# Patient Record
Sex: Female | Born: 1935 | Race: White | Hispanic: No
Health system: Western US, Academic
[De-identification: ages and names within clinical notes are randomized; demographics above are authoritative.]

## PROBLEM LIST (undated history)

## (undated) DIAGNOSIS — M629 Disorder of muscle, unspecified: Secondary | ICD-10-CM

## (undated) DIAGNOSIS — M1990 Unspecified osteoarthritis, unspecified site: Secondary | ICD-10-CM

## (undated) DIAGNOSIS — E079 Disorder of thyroid, unspecified: Secondary | ICD-10-CM

## (undated) DIAGNOSIS — G479 Sleep disorder, unspecified: Secondary | ICD-10-CM

## (undated) DIAGNOSIS — M810 Age-related osteoporosis without current pathological fracture: Secondary | ICD-10-CM

## (undated) DIAGNOSIS — E785 Hyperlipidemia, unspecified: Secondary | ICD-10-CM

## (undated) DIAGNOSIS — I499 Cardiac arrhythmia, unspecified: Secondary | ICD-10-CM

## (undated) DIAGNOSIS — I10 Essential (primary) hypertension: Secondary | ICD-10-CM

## (undated) DIAGNOSIS — R42 Dizziness and giddiness: Secondary | ICD-10-CM

## (undated) DIAGNOSIS — K635 Polyp of colon: Secondary | ICD-10-CM

## (undated) DIAGNOSIS — J309 Allergic rhinitis, unspecified: Secondary | ICD-10-CM

## (undated) DIAGNOSIS — M8580 Other specified disorders of bone density and structure, unspecified site: Secondary | ICD-10-CM

## (undated) HISTORY — DX: Disorder of thyroid, unspecified: E07.9

## (undated) HISTORY — DX: Age-related osteoporosis without current pathological fracture: M81.0

## (undated) HISTORY — PX: PR UNLISTED PROCEDURE SHOULDER: 23929

## (undated) HISTORY — DX: Cardiac arrhythmia, unspecified: I49.9

## (undated) HISTORY — DX: Sleep disorder, unspecified: G47.9

## (undated) HISTORY — PX: PR UNLISTED PROCEDURE FOOT/TOES: 28899

## (undated) HISTORY — PX: PR XCAPSL CTRC RMVL INSJ IO LENS PROSTH CPLX WO ECP: 66982

## (undated) HISTORY — DX: Hyperlipidemia, unspecified: E78.5

## (undated) HISTORY — DX: Unspecified osteoarthritis, unspecified site: M19.90

## (undated) HISTORY — DX: Disorder of muscle, unspecified: M62.9

## (undated) HISTORY — DX: Essential (primary) hypertension: I10

## (undated) HISTORY — DX: Allergic rhinitis, unspecified: J30.9

## (undated) HISTORY — DX: Other specified disorders of bone density and structure, unspecified site: M85.80

## (undated) HISTORY — DX: Polyp of colon: K63.5

## (undated) HISTORY — DX: Dizziness and giddiness: R42

---
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
## 1974-07-17 HISTORY — PX: PR VAGINAL HYSTERECTOMY UTERUS 250 GM/<: 58260

## 1994-07-17 HISTORY — PX: PR RPR COMPLEX RETINA DETACH VITRECT &MEMBRANE PEEL: 67113

## 1997-12-22 ENCOUNTER — Ambulatory Visit (HOSPITAL_COMMUNITY): Admission: RE | Admit: 1997-12-22 | Discharge: 1997-12-22 | Payer: Self-pay | Admitting: Gastroenterology

## 1998-06-09 ENCOUNTER — Other Ambulatory Visit: Admission: RE | Admit: 1998-06-09 | Discharge: 1998-06-09 | Payer: Self-pay | Admitting: Obstetrics and Gynecology

## 1999-05-18 ENCOUNTER — Other Ambulatory Visit: Admission: RE | Admit: 1999-05-18 | Discharge: 1999-05-18 | Payer: Self-pay | Admitting: Obstetrics and Gynecology

## 1999-09-28 ENCOUNTER — Encounter: Admission: RE | Admit: 1999-09-28 | Discharge: 1999-09-28 | Payer: Self-pay | Admitting: Obstetrics and Gynecology

## 1999-09-28 ENCOUNTER — Encounter: Payer: Self-pay | Admitting: Obstetrics and Gynecology

## 2000-10-04 ENCOUNTER — Encounter: Payer: Self-pay | Admitting: Obstetrics and Gynecology

## 2000-10-04 ENCOUNTER — Encounter: Admission: RE | Admit: 2000-10-04 | Discharge: 2000-10-04 | Payer: Self-pay | Admitting: Obstetrics and Gynecology

## 2001-03-27 ENCOUNTER — Ambulatory Visit (HOSPITAL_COMMUNITY): Admission: RE | Admit: 2001-03-27 | Discharge: 2001-03-27 | Payer: Self-pay | Admitting: Gastroenterology

## 2001-03-27 ENCOUNTER — Encounter (INDEPENDENT_AMBULATORY_CARE_PROVIDER_SITE_OTHER): Payer: Self-pay | Admitting: Specialist

## 2001-06-05 ENCOUNTER — Other Ambulatory Visit: Admission: RE | Admit: 2001-06-05 | Discharge: 2001-06-05 | Payer: Self-pay | Admitting: Obstetrics and Gynecology

## 2001-07-17 HISTORY — PX: PR UNLISTED PROCEDURE SPINE: 22899

## 2001-10-07 ENCOUNTER — Encounter: Admission: RE | Admit: 2001-10-07 | Discharge: 2001-10-07 | Payer: Self-pay | Admitting: Family Medicine

## 2001-10-07 ENCOUNTER — Encounter: Payer: Self-pay | Admitting: Family Medicine

## 2002-06-05 ENCOUNTER — Other Ambulatory Visit: Admission: RE | Admit: 2002-06-05 | Discharge: 2002-06-05 | Payer: Self-pay | Admitting: Family Medicine

## 2002-10-13 ENCOUNTER — Encounter: Admission: RE | Admit: 2002-10-13 | Discharge: 2002-10-13 | Payer: Self-pay | Admitting: Family Medicine

## 2002-10-13 ENCOUNTER — Encounter: Payer: Self-pay | Admitting: Family Medicine

## 2003-07-18 HISTORY — PX: PR UNLISTED PROCEDURE LEG/ANKLE: 27899

## 2003-10-20 ENCOUNTER — Encounter: Admission: RE | Admit: 2003-10-20 | Discharge: 2003-10-20 | Payer: Self-pay | Admitting: Family Medicine

## 2004-09-28 ENCOUNTER — Other Ambulatory Visit: Admission: RE | Admit: 2004-09-28 | Discharge: 2004-09-28 | Payer: Self-pay | Admitting: Family Medicine

## 2004-11-01 ENCOUNTER — Encounter: Admission: RE | Admit: 2004-11-01 | Discharge: 2004-11-01 | Payer: Self-pay | Admitting: Family Medicine

## 2005-11-06 ENCOUNTER — Encounter: Admission: RE | Admit: 2005-11-06 | Discharge: 2005-11-06 | Payer: Self-pay | Admitting: Family Medicine

## 2006-04-16 DIAGNOSIS — K635 Polyp of colon: Secondary | ICD-10-CM

## 2006-04-16 HISTORY — DX: Polyp of colon: K63.5

## 2006-07-17 DIAGNOSIS — R42 Dizziness and giddiness: Secondary | ICD-10-CM

## 2006-07-17 HISTORY — DX: Dizziness and giddiness: R42

## 2006-11-12 ENCOUNTER — Encounter: Admission: RE | Admit: 2006-11-12 | Discharge: 2006-11-12 | Payer: Self-pay | Admitting: Family Medicine

## 2007-11-13 ENCOUNTER — Encounter: Admission: RE | Admit: 2007-11-13 | Discharge: 2007-11-13 | Payer: Self-pay | Admitting: Family Medicine

## 2007-11-25 ENCOUNTER — Encounter: Admission: RE | Admit: 2007-11-25 | Discharge: 2007-11-25 | Payer: Self-pay | Admitting: Family Medicine

## 2008-07-17 DIAGNOSIS — M8580 Other specified disorders of bone density and structure, unspecified site: Secondary | ICD-10-CM

## 2008-07-17 HISTORY — DX: Other specified disorders of bone density and structure, unspecified site: M85.80

## 2008-11-06 ENCOUNTER — Encounter (HOSPITAL_BASED_OUTPATIENT_CLINIC_OR_DEPARTMENT_OTHER): Payer: Self-pay | Admitting: Orthopaedic Surgery

## 2008-11-06 ENCOUNTER — Ambulatory Visit (HOSPITAL_BASED_OUTPATIENT_CLINIC_OR_DEPARTMENT_OTHER): Payer: Medicare Other | Admitting: Orthopaedic Surgery

## 2008-11-06 VITALS — BP 130/82 | Resp 12 | Ht 67.0 in | Wt 245.0 lb

## 2008-11-06 LAB — PR X-RAY SHOULDER 2+ VW

## 2008-11-06 NOTE — Progress Notes (Signed)
[facility] [place] [facility] of Orthopaedics & Sports Medicine  Shoulder And Elbow Service       [facility]; [address] [place] ; [place], [place]  [address]  [phone]; [phone]    [email or web address]    Primary Care Provider:  Concha Pyo, DO  [facility] [address]  [place], [place] [address]    Referring Provider:  Pcp Outside  Used For Non [facility] Pcp Not In Epic           Patient Care Team:  No providers found        Subjective History  We had the most wonderful pleasure of seeing [name] for a clinical visit.  She is most delightful 75 year old female with left shoulder pain since a fall in Dec 09 when she landed on her left shoulder and face.  She has had persistent and some weakness.  She has not had any further treatment for her shoulder since then but had an MRI scan that showed a RTC tear and was sent in for evaluation.       Related Information   Chief Complaint   Patient presents with   . Musculoskeletal Problem     left shoulder pain       Handed: right handed     Work Related Problem: No    Is a lawyer involved with this problem: No     History of Present Illness  1. Location - where is the problem located? Left Shoulder    2. Severity - Intensity of Pain/discomfort: (1 = No Pain, 10 = Severe Pain):  Pain Level: 5     3. Context - How did this problem begin? Refer to subjective note above. and Fall     4. Modifying Factors -  What makes symptom(s) worse?   Using affected side  Work  Exercise      What improves your symptom(s)?   Rest  Ice  Heat         Special Questionnaires   Right Left    SANE (Single Assessment Numeric Evaluation)  100% 50%      When applicable, Simple Shoulder Test and/or Simple Elbow Test data is available in the patient's medical record.        Review of Systems  Constitutional: weight gain and insomnia   Eyes:  glasses/contacts and cataracts    Ear/Nose/Throat: sinus trouble   Cardiovascular: irregular heartbeat   Respiratory: negative for shortness of breath, difficulty breathing, lung disease and persistent cough   Gastrointestinal: negative for decreased appetite, constipation, heartburn, nausea, diarrhea and hepatitis   Musculoskeletal:  arthritis and fractures   Genitourinary: negative for kidney stone, bladder/kidney infections, prostate problems and painful urinating   Skin/Integumentary:  negative for masses, blisters, non-healing wounds and dermatitis   Neurological:  negative for seizures, tingling, numbness and severe headaches   Psychiatric:  negative for anxiety, depression or other mental health issues   Endocrine:  negative for increased thirst, diabetes and thyroid disorders   Blood/Lymphatic: negative for bleeding or clotting problems, anemia and swollen or enlarged lymph nodes   Immunological: negative for HIV/AIDS, Sjgren's syndrome, scleroderma, hay fever and lupus   Cancer: negative for cancer        Other History  Please note that an extensive history was taken regarding Monique Martinez's past medical history, medication history,  past surgical history, allergies, family history and social history.  Please note that if one or more of the following history components states that there is no history for a particular component, this is a default statement created by our electronic medical record software and should instead read "non contributory for orthopedic concerns."    Social History   Occupational History   . Not on file.   Social History Main Topics   . Tobacco Use: Never   . Alcohol Use: No   . Drug Use: No   . Sexually Active: Not on file       Marital Status:    Number of adults living with Monique Martinez:         Past Medical History   Diagnosis Date   . HYPERLIPIDEMIA NEC/NOS    . SLEEP DISTURBANCE NOS            Past Surgical History   Procedure Date   . Vaginal hysterectomy 1976   . Remove tonsils and  adenoids 1949   . Repair retinal detach, cplx 1996   . Cataract surgery, complex 2007/2010   . Leg/ankle surgery proc unlisted 2005     tib/fib fx   . Spine surgery procedure unlisted 2003     left L4-L5 microdiscectomy       Review of patient's allergies indicates:  No Known Allergies     Current outpatient prescriptions   Medication Sig   . Aspirin 81 MG OR TABS daily   . Calcium-Vitamin D-Vitamin K (CALCIUM + D + K) 750-500-40 MG-UNT-MCG OR TABS daily   . Estrogens Conjugated (PREMARIN) 0.625 MG OR TABS daily   . Fenofibrate (TRICOR) 48 MG OR TABS daily   . Glucosamine-Chondroit-Vit C-Mn (GLUCOSAMINE 1500 COMPLEX) OR CAPS daily   . Ibandronate Sodium (BONIVA) 150 MG OR TABS monthly   . Metoprolol Succinate 100 MG OR TB24 daily   . Multiple Vitamins-Iron (MULTIVITAMIN/IRON) OR TABS daily   . POLYCARBOPHIL CALCIUM (FIBERCON) 625 MG OR TABS 2xdaily   . PrednisoLONE Acetate 1 % OP SUSP 4x daily       Family History   Problem Relation   . Arthritis    . Cancer    . Diabetes    . Heart disease    . Osteoporosis    . Thyroid               Physical Examination  Please note that a for the findings below:       "-" signifies a Negative finding       "+" signifies a Positive finding       a blank denotes test was not performed or documented    Constitutional:  General appearance:  Monique Martinez is a well developed, well nourished female in no apparent distress.   BP 130/82  Resp 12  Ht 5\' 7"  (1.702 m)  Wt 245 lb (111.131 kg)    Psychological:  Her judgment, insight, memory, mood and affect appear to be within normal limits    Neurological:  She is alert and oriented without any obvious gross neurological deficits    Repiratory:  She is without any obvious respiratory distress    ENT:  She is able to hear and understand verbal questions and commands    Upper Extremities:  Cardiovascular Inspection: Right Left   Edema Negative Negative             Respiratory: Right Left   Cyanosis Negative Negative  Hematologic:  Right Left   Ecchymosis Negative Negative             Skin Inspection:  Right Left   Rashes  Negative Negative   Lesions Negative Negative   Ulcers Negative Negative   Erythema Negative Negative   Signs of infection Negative Negative             Musculoskeletal Inspection-Visual:  Right Left   Obvious Musculoskeletal Deformity Negative Negative   Scapular Dyskinesis     AC Joint Asymmetry     SC Joint Asymmetry     "Popeye" Bicep deformity               Musculoskeletal Inspection-Palpation:  Right Left   Shoulder Crepitus   -   Rotator Cuff Defect Palpated  -   Pain With Palpation Of AC Joint  -   Painful Palpation Of Bicipital Groove  -   Bicep Saw Test (biceps)  -   Yergason's (biceps)     Lateral Epicondyle Pain               Range of Motion of Shoulder: Right Left   Total Active Forward Elevation 160 160   Total Active External Rotation 60 55   Internal Rotation Back T7 T11   External Rotation Abducted 80 75   Internal Rotation Abducted 90 80   Cross Body Adduction               Range of Motion of Elbow: Right Left   Elbow Extension      Elbow Flexion      Forearm Pronation      Forearm Supination                Strength of Shoulder: Right Left   Supraspinatus 5 5   External Rotation 5 4 with pain   Internal Rotation 5 5   Deltoid Abduction 5 5   Internal Rotation Liftoff     Pseudoparalysis Negative Negative             Strength of Elbow: Right Left   Elbow Flexion     Elbow Extension               Strength of Hand and Wrist: Right Left   Wrist Dorsiflexion      Wrist Plantarflexion      Grip Strength     Finger Extension     Interossei Use               Stability Tests of Shoulder: Right Left   Obvious Gross Instability  Negative Negative   Anterior Superior Escape     Apprehension Test      Jobe Relocation Test      Anterior Load and Shift Test     Posterior Load and Shift Test     Posterior Shoulder Instability Jerk Test     Kim (labral)     Sulcus Sign     O'Brian's (labral)     Crank (labral)     SLAP  (labral)     Dynamic External Rotation Shear (labral)     Speed's (biceps/labral)     Kibler Anterior Slide Test (labral)                Stability Tests of Elbow: Right Left   Valgus Stress Test       Moving Valgus Stress Test      Varus Stress Test       Posterolateral  Shift Test      Push Up Test      Table Top Test               Neurological: Right Left   Decreased Sensation Median Nerve  Negative Negative   Decreased Sensation Radial Nerve  Negative Negative   Decreased Sensation Ulnar Nerve  Negative Negative   Abnormal Bicep DTR               Other Tests: Right Left                    Diagnostic Tests and Studies   X-ray Studies:  All images were independently reviewed by our expert shoulder and elbow team.    Please note that a for the findings below:       "-" signifies a Negative finding       "+" signifies a Positive finding       a blank denotes finding is not applicable.     Left Upper Extremity:  Radiographs:     General:     Osteopenic bone Negative         Glenohumeral joint space narrowing Negative    Glenoid dysplasia  Negative    Glenoid biconcavity Negative         Humeral dysplasia Negative    Humeral post traumatic changes Negative    Humeral post surgical changes Negative    Humeral osteophyte formation Negative         Elbow joint space narrowing     Elbow osteophyte formation     Elbow rheumatoid changes          Acromioclavicular joint space narrowing               Stability:     Anterior shoulder dislocation Negative    Posterior shoulder dislocation Negative    High riding humeral head Negative    Inferior humeral head subluxation Negative         Elbow dislocation          Acromioclavicular joint separation      Sternoclavicular joint dislocation               Fractures:     Humeral fracture Negative    Glenoid fracture Negative    Elbow fracture     Clavicle fracture                     Other Imaging Studies:  An outside MRI scan demonstrates a full thickness rotator cuff lesion with  atrophy and retraction of the supraspinatus tendon.    Labratory:  None       Assessment  and Plan    In summary, Ms. Gary is a 75 year old female with left shoulder discomfort and dysfunction, which appears to be caused by rotator cuff syndrome.     Her xrays and MRI scan show that she has a rotator cuff tear that has been going on for quite some time.  This most likely is an acute on chronic rotator cuff tear.  Clinically she has weakness and pain with her infraspinatus but has great strength with minimal pain when stressing the supraspinatus.  I think she will do well with a course of non-operative treatment and we can get her into some therapy.  She has already been compensating for the degenerative supraspinatus tendon and with some gentle strengthening she should be able to regain most of her strength.  She responded well to steroid injections on the right shoulder after an injury several years ago and I think it may help to calm down the inflammation today as well.  I will see her back in 3 months for a repeat check but she should do well without surgery.    Procedure Note:  A procedural pause noted the patient's name allergies and procedure to be performed. Under sterile conditions 1 cc of Celestone 5 cc of 1% lidocaine plain and 5 cc of 0.5% bupivacaine were injected into the left shoulder subacromial space through a posterior portal without complications. The patient tolerated the injection well. The patient noted mild improvement in the shoulder pain prior to discharge from clinic.                       Fayette Pho, MD  Orthopaedics and Sports Medicine  [facility]  Shoulder and Elbow Team

## 2008-11-06 NOTE — Patient Instructions (Signed)
It was a pleasure seeing you in clinic today.  Please let us know if you have any questions for us and we look forward to seeing at your next appointment.    You can schedule an appointment to see us by calling [phone].    You may also find useful information about various orthopaedic conditions and what we do at [email or web address] .

## 2008-11-18 ENCOUNTER — Encounter: Admission: RE | Admit: 2008-11-18 | Discharge: 2008-11-18 | Payer: Self-pay | Admitting: Family Medicine

## 2008-11-18 NOTE — Progress Notes (Addendum)
Addended by: ENGLISH, JONATHAN E on: 11/18/2008      Modules accepted: Orders

## 2008-12-04 ENCOUNTER — Encounter (HOSPITAL_BASED_OUTPATIENT_CLINIC_OR_DEPARTMENT_OTHER): Payer: Self-pay | Admitting: Orthopaedic Surgery

## 2008-12-04 NOTE — Progress Notes (Signed)
[facility] Physical Therapy Assessment (5.17.10)

## 2009-01-20 ENCOUNTER — Encounter (HOSPITAL_BASED_OUTPATIENT_CLINIC_OR_DEPARTMENT_OTHER): Payer: Self-pay | Admitting: Orthopaedic Surgery

## 2009-02-05 ENCOUNTER — Encounter (HOSPITAL_BASED_OUTPATIENT_CLINIC_OR_DEPARTMENT_OTHER): Payer: Medicare Other | Admitting: Orthopaedic Surgery

## 2009-11-19 ENCOUNTER — Encounter: Admission: RE | Admit: 2009-11-19 | Discharge: 2009-11-19 | Payer: Self-pay | Admitting: Family Medicine

## 2010-03-06 ENCOUNTER — Other Ambulatory Visit: Payer: Self-pay

## 2010-03-13 ENCOUNTER — Other Ambulatory Visit: Payer: Self-pay

## 2010-07-17 HISTORY — PX: OTHER SURGICAL HISTORY: SHX169

## 2010-07-17 HISTORY — PX: COLONOSCOPY: SHX174

## 2010-08-07 ENCOUNTER — Encounter: Payer: Self-pay | Admitting: Family Medicine

## 2010-10-13 ENCOUNTER — Other Ambulatory Visit (HOSPITAL_COMMUNITY): Payer: Self-pay | Admitting: Orthopedic Surgery

## 2010-10-13 DIAGNOSIS — M7120 Synovial cyst of popliteal space [Baker], unspecified knee: Secondary | ICD-10-CM

## 2010-10-18 ENCOUNTER — Other Ambulatory Visit (HOSPITAL_COMMUNITY): Payer: Self-pay | Admitting: Orthopedic Surgery

## 2010-10-18 ENCOUNTER — Ambulatory Visit
Admission: RE | Admit: 2010-10-18 | Discharge: 2010-10-18 | Disposition: A | Discharge status: Home / Self Care | Payer: Medicare Other | Source: Ambulatory Visit | Attending: Orthopedic Surgery | Admitting: Orthopedic Surgery

## 2010-10-18 DIAGNOSIS — M7120 Synovial cyst of popliteal space [Baker], unspecified knee: Secondary | ICD-10-CM

## 2010-10-31 ENCOUNTER — Other Ambulatory Visit: Payer: Self-pay | Admitting: Family Medicine

## 2010-10-31 DIAGNOSIS — Z1231 Encounter for screening mammogram for malignant neoplasm of breast: Secondary | ICD-10-CM

## 2010-11-22 ENCOUNTER — Ambulatory Visit
Admission: RE | Admit: 2010-11-22 | Discharge: 2010-11-22 | Disposition: A | Discharge status: Home / Self Care | Payer: Medicare Other | Source: Ambulatory Visit | Attending: Family Medicine | Admitting: Family Medicine

## 2010-11-22 DIAGNOSIS — Z1231 Encounter for screening mammogram for malignant neoplasm of breast: Secondary | ICD-10-CM

## 2010-12-02 NOTE — Procedures (Signed)
[facility]  Patient:    Tracy Bean, Tracy Bean Visit Number: [id number] MRN: [id number]          Service Type: END Location: ENDO Attending Physician:  Louie Bun Proc. Date: 03/27/01 Admit Date:  03/27/2001   CC:         Dellis Anes. Idell Pickles, M.D.   Procedure Report  PROCEDURE:  Colonoscopy with polypectomy.  INDICATION FOR PROCEDURE:  History of adenomatous colon polyp three years ago.  DESCRIPTION OF PROCEDURE:  The patient was placed in the left lateral decubitus position and placed on the pulse monitor with continuous low-flow oxygen delivered by nasal cannula.  She was sedated with 70 mg IV Demerol and 8 mg IV Versed.  Other than the 6 mm cecal polyp which was hot biopsied, there were no abnormalities seen in the cecum, ascending, and transverse colon. Within the descending and sigmoid colon there were a few scattered diverticula.  The rectum appeared normal.  The colonoscope was then withdrawn and the patient returned to the recovery room in stable condition.  She tolerated the procedure well, and there were no immediate complications.  IMPRESSION: 1. Cecal polyp. 2. Left-sided diverticulosis.  PLAN:  Await histology and will probably repeat colonoscopy in five years. Attending Physician:  Louie Bun DD:  03/27/01 TD:  03/27/01 Job: [id number]

## 2011-11-01 ENCOUNTER — Other Ambulatory Visit: Payer: Self-pay | Admitting: Family Medicine

## 2011-11-01 DIAGNOSIS — Z1231 Encounter for screening mammogram for malignant neoplasm of breast: Secondary | ICD-10-CM

## 2011-11-24 ENCOUNTER — Ambulatory Visit
Admission: RE | Admit: 2011-11-24 | Discharge: 2011-11-24 | Disposition: A | Discharge status: Home / Self Care | Payer: Medicare Other | Source: Ambulatory Visit | Attending: Family Medicine | Admitting: Family Medicine

## 2011-11-24 DIAGNOSIS — Z1231 Encounter for screening mammogram for malignant neoplasm of breast: Secondary | ICD-10-CM

## 2012-11-07 ENCOUNTER — Other Ambulatory Visit: Payer: Self-pay

## 2012-11-07 DIAGNOSIS — Z1231 Encounter for screening mammogram for malignant neoplasm of breast: Secondary | ICD-10-CM

## 2012-11-26 ENCOUNTER — Ambulatory Visit
Admission: RE | Admit: 2012-11-26 | Discharge: 2012-11-26 | Disposition: A | Discharge status: Home / Self Care | Payer: Medicare Other | Source: Ambulatory Visit

## 2012-11-26 DIAGNOSIS — Z1231 Encounter for screening mammogram for malignant neoplasm of breast: Secondary | ICD-10-CM

## 2013-10-31 ENCOUNTER — Other Ambulatory Visit: Payer: Self-pay

## 2013-10-31 DIAGNOSIS — Z1231 Encounter for screening mammogram for malignant neoplasm of breast: Secondary | ICD-10-CM

## 2013-12-02 ENCOUNTER — Ambulatory Visit
Admission: RE | Admit: 2013-12-02 | Discharge: 2013-12-02 | Disposition: A | Discharge status: Home / Self Care | Payer: Medicare Other | Source: Ambulatory Visit

## 2013-12-02 ENCOUNTER — Encounter (INDEPENDENT_AMBULATORY_CARE_PROVIDER_SITE_OTHER): Payer: Self-pay

## 2013-12-02 DIAGNOSIS — Z1231 Encounter for screening mammogram for malignant neoplasm of breast: Secondary | ICD-10-CM

## 2014-10-29 ENCOUNTER — Other Ambulatory Visit: Payer: Self-pay

## 2014-10-29 DIAGNOSIS — Z1231 Encounter for screening mammogram for malignant neoplasm of breast: Secondary | ICD-10-CM

## 2014-12-04 ENCOUNTER — Ambulatory Visit
Admission: RE | Admit: 2014-12-04 | Discharge: 2014-12-04 | Disposition: A | Discharge status: Home / Self Care | Payer: Commercial Managed Care - HMO | Source: Ambulatory Visit

## 2014-12-04 DIAGNOSIS — Z1231 Encounter for screening mammogram for malignant neoplasm of breast: Secondary | ICD-10-CM

## 2015-08-27 ENCOUNTER — Telehealth (HOSPITAL_BASED_OUTPATIENT_CLINIC_OR_DEPARTMENT_OTHER): Payer: Self-pay

## 2015-08-27 NOTE — Telephone Encounter (Signed)
Received a call from Dr. Shirley Muscat, the pt's PCP.  Pt is a previous pt of Dr. Lunette Stands.    He is calling to get advice on how to proceed with the pt's Left Humerus fx.  Images have been pushed to PACS.    Pt lives in [place], [place].    Please call Dr. Katrinka Blazing at [phone]

## 2015-08-31 ENCOUNTER — Telehealth (HOSPITAL_BASED_OUTPATIENT_CLINIC_OR_DEPARTMENT_OTHER): Payer: Self-pay | Admitting: Physician Assistant

## 2015-08-31 NOTE — Telephone Encounter (Signed)
I spoke with Dr Katrinka Blazing- he works in the ED in [place].  His mom Shakiera sustained a ground level fall- likely mechanical while moving and handling boxes at home, about 9 days ago 08/22/15.    X-rays show a fracture/large bony bankart, the humeral head looks anteroinferiorly subluxated.    She is normally is a good state of shoulder function, helps to care for her teenage grandchild, she has SVT some neuropathy- bad back issues but is ambulatory with most often no use of her canes.    I discussed the nature of injury and that she may need open reduction with internal fixation.  The non operative treatments would be a sling and allowing the shoulder to stiffen up.  There may be high indication for surgery if she does not have marked surgical risk factors.  Katherina Right,  PA-C  [place] [facility]er and Elbow Service

## 2015-08-31 NOTE — Telephone Encounter (Signed)
Called and spoke to pt, advised she has been scheduled with De Hollingshead PA-C this Thursday 09/02/15 at 1:00, pt is coming from [facility] in [place], will let us know if delayed or can't make it due to pass conditions

## 2015-08-31 NOTE — Telephone Encounter (Signed)
I called the patient and discussed surgery with her. She is going to come to see Fritzi Mandes on Thursday as early as possible (probably 1pm). Let's book a 1pm appt with Fritzi Mandes. I will meet the patient on Friday before surgery.     Barbara Cower

## 2015-08-31 NOTE — Telephone Encounter (Signed)
I called Zoie at home [phone], she is living with her ED physician son Peyton Najjar, takes care of her 80 y/o grandson.  She fell 9 days ago 08/22/15, mechanical fall sorting out boxes and has a left shoulder glenoid fracture dislocation, is comfortable in a sling, no paresthesia, notes normal hand function.  Hx of left shoulder large SST retracted and atrophied cuff tear but shoulder was working rather well, able to reach and lift overhead and do whatever she needed wit it before this fall.  That is no longer the case- do to fall.    She has balance issues from bad back neuropathy, SVT not needing cardiology and only her PCP on metoprolol and ASA , digoxin, and exogenous thyroid hormone.    Is okay to come down for evaluation at [facility] has family to stay with in [place].  We discussed non op treatment of sling use allowing shoulder to get stiff, serial and longitudinal exam vs surgery open reduction with internal fixation vs arthroplasty.    Would be very hard to impossible to come sooner then Thursday or Friday for evaluation.  We will have our staff reach out to her to coordinate appt with Dr Raynald Kemp and team.  Katherina Right,  PA-C  [place] Shoulder and Elbow Service

## 2015-09-02 ENCOUNTER — Ambulatory Visit (HOSPITAL_BASED_OUTPATIENT_CLINIC_OR_DEPARTMENT_OTHER): Payer: Medicare Other

## 2015-09-02 ENCOUNTER — Ambulatory Visit (HOSPITAL_BASED_OUTPATIENT_CLINIC_OR_DEPARTMENT_OTHER): Payer: Medicare Other | Admitting: Physician Assistant

## 2015-09-02 ENCOUNTER — Encounter (HOSPITAL_BASED_OUTPATIENT_CLINIC_OR_DEPARTMENT_OTHER): Payer: Self-pay | Admitting: Physician Assistant

## 2015-09-02 VITALS — BP 151/70 | HR 67 | Temp 98.2°F | Ht 65.75 in | Wt 225.0 lb

## 2015-09-02 DIAGNOSIS — S42142A Displaced fracture of glenoid cavity of scapula, left shoulder, initial encounter for closed fracture: Secondary | ICD-10-CM

## 2015-09-02 DIAGNOSIS — Z01818 Encounter for other preprocedural examination: Secondary | ICD-10-CM

## 2015-09-02 DIAGNOSIS — S43012A Anterior subluxation of left humerus, initial encounter: Secondary | ICD-10-CM

## 2015-09-02 DIAGNOSIS — M75122 Complete rotator cuff tear or rupture of left shoulder, not specified as traumatic: Secondary | ICD-10-CM

## 2015-09-02 DIAGNOSIS — M25512 Pain in left shoulder: Secondary | ICD-10-CM

## 2015-09-02 DIAGNOSIS — S42292A Other displaced fracture of upper end of left humerus, initial encounter for closed fracture: Secondary | ICD-10-CM

## 2015-09-02 LAB — COMPREHENSIVE METABOLIC PANEL
ALT (GPT): 12 U/L (ref 7–33)
AST (GOT): 14 U/L (ref 9–38)
Albumin: 3.9 g/dL (ref 3.5–5.2)
Alkaline Phosphatase (Total): 82 U/L (ref 49–199)
Anion Gap: 8 (ref 4–12)
Bilirubin (Total): 0.5 mg/dL (ref 0.2–1.3)
Calcium: 9.6 mg/dL (ref 8.9–10.2)
Carbon Dioxide, Total: 31 meq/L (ref 22–32)
Chloride: 100 meq/L (ref 98–108)
Creatinine: 0.73 mg/dL (ref 0.38–1.02)
GFR, Calc, African American: >60 mL/min/{1.73_m2}
GFR, Calc, European American: >60 mL/min/{1.73_m2}
Glucose: 88 mg/dL (ref 62–125)
Potassium: 4 meq/L (ref 3.6–5.2)
Protein (Total): 6.5 g/dL (ref 6.0–8.2)
Sodium: 139 meq/L (ref 135–145)
Urea Nitrogen: 15 mg/dL (ref 8–21)

## 2015-09-02 LAB — CBC, DIFF
% Basophils: 0 %
% Eosinophils: 1 %
% Immature Granulocytes: 0 %
% Lymphocytes: 34 %
% Monocytes: 9 %
% Neutrophils: 56 %
% Nucleated RBC: 0 %
Absolute Eosinophil Count: 0.05 10*3/uL (ref 0.00–0.50)
Absolute Lymphocyte Count: 3.29 10*3/uL (ref 1.00–4.80)
Basophils: 0.04 10*3/uL (ref 0.00–0.20)
Hematocrit: 46 % — ABNORMAL HIGH (ref 36–45)
Hemoglobin: 14.8 g/dL (ref 11.5–15.5)
Immature Granulocytes: 0.03 10*3/uL (ref 0.00–0.05)
MCH: 30.9 pg (ref 27.3–33.6)
MCHC: 31.9 g/dL — ABNORMAL LOW (ref 32.2–36.5)
MCV: 97 fL (ref 81–98)
Monocytes: 0.86 10*3/uL — ABNORMAL HIGH (ref 0.00–0.80)
Neutrophils: 5.34 10*3/uL (ref 1.80–7.00)
Nucleated RBC: 0 10*3/uL
Platelet Count: 260 10*3/uL (ref 150–400)
RBC: 4.79 10*6/uL (ref 3.80–5.00)
RDW-CV: 13.1 % (ref 11.6–14.4)
WBC: 9.61 10*3/uL (ref 4.3–10.0)

## 2015-09-02 LAB — TYPE AND SCREEN
ABO/Rh: A POS
Antibody Screen: NEGATIVE

## 2015-09-02 LAB — BLOOD TYPE CONFIRMATION: ABO/Rh: A POS

## 2015-09-02 NOTE — Progress Notes (Signed)
Preoperative instruction given per [facility] protocol. Literature for [employer], and post-op instructions given to pt.  Discussed DOS and sequence of care/events.  Stressed NPO status preop, shower X2 prior to surgery with antibacterial soap. Pt advised to stop/hold the following meds:  Pt did not know she would be having surgery tomorrow, has been taking aspirin 81 mg, instructed not to take until further notice, will continue digoxin and metoprolol.  Call if further questions/concerns.

## 2015-09-02 NOTE — Patient Instructions (Signed)
It was a pleasure seeing you in clinic today.  Please let us know if you have any questions for us and we look forward to seeing at your next appointment.    If you have any questions, concerns, or would like to contact Hasnain Manheim Harvey, PA-C or Dr. Hsu directly, please feel free to e-mail me directly at [email or web address].    You can schedule an appointment to see us by calling [phone].    You may also find useful information about various orthopaedic conditions and what we do at [email or web address] .

## 2015-09-02 NOTE — Progress Notes (Signed)
[facility] Department of Orthopaedics & Sports Medicine  Shoulder And Elbow Service       [facility]; [address] [place] ; [place], [place]  [address]  [phone]; [phone]) [phone]    [email or web address]    09/02/2015    Patient Name: Monique Martinez  Date of Birth: [birth year]  Medical Record #: [id number]    Primary Care Provider:  Concha Pyo, DO  Address Alert - Do Not Mail      Referring Provider:  No ref. provider found              Patient Care Team:  No providers found     Assessment  and Plan  Monique Martinez is a 82 year old female with acute left shoulder displaced scapula fracture extending across the glenoid fossa and lateral border of the scapula with anterior inferior dislocation of the humeral head, in the setting of chronic large rotator cuff tear. She had an extensive conversation with the attending surgeon Dr. Judd Gaudier over the phone prior to today's visit. I reviewed with her again relevant anatomy, pathophysiology and management of her injury. Given the displacement of the fracture and instability of the joint, in the setting of chronic rotator cuff disease, surgery is recommended to optimize function and is likely to get her the most predictable prognosis.  Surgery would consist of left reverse shoulder arthroplasty with ORIF glenoid/scapula. I discussed the risks and benefits of surgery; risks including general medical or anesthetic complication, pain, bleeding, infection, damage to neurovascular structures, need for further surgery, perioperative dislocation, scapular or acromial fractures, loosening of the components, and peri-prosthetic fractures.  I also discussed the rehabilitation after reverse shoulder arthroplasty. Typically, the shoulder is placed in a sling for 6 weeks after surgery, then light motion exercises are started. The operative hand and elbow should always be in front of the body for the first 2 months after surgery to avoid dislocation of  the prosthesis. We have had good success with the reverse arthroplasty but are unsure of the longevity of this particular prosthesis. We ask the patient not to perform repetitive activities with the operative shoulder and avoid lifting more than 10-15 pounds with that arm to avoid premature wear of the prosthesis. She understands the post-operative rehabilitation, understands the risks of the surgery, and elects to proceed with surgery. I reviewed and signed the consent with the patient in the office today, and we will proceed with collection of preoperative labs this afternoon. She does have a history of possibly afib, rate controlled with digoxin and metoprolol, in the setting of two previous episodes of SVT. Reportedly at [facility] ER she had a stable EKG, has been asymptomatic. I put in a phone call and requested a faxed over copy of that EKG to include in her medical record.       The postoperative Support Plan calls assistance from:    Telephone Information:   Home Phone [phone]   Work Phone Not on file.   Mobile Not on file.       Accompany home after surgery: Curlene Labrum   Contact Number: [phone]   Relationship: niece            Procedures scheduled/performed: Left shoulder reverse shoulder arthroplasty and ORIF glenoid/scapula with Dr. Raynald Kemp tomorrow  Pain management: no recs today; postoperative pain medication will be prescribed by our team while in house and after discharge  Therapy/motion: none today; appropriate rehab will be  prescribed postoperatively   Follow-up: I will plan to see Monique Martinez back about 2 weeks after the above mentioned procedure for postop check.  Imaging at next appointment: Per Dr. Denyse Amass instruction         Chief Complaint   Patient presents with   . Pre-Op Exam     Pre Op Reverse L shoulder arthroplasty       SUBJECTIVE HISTORY  She is a 82 year old female with left shoulder pain following a fall Sunday, August 22, 2015. Was at her daughter's home, and tripped.  Fell predominantly on the left shoulder/arm. No significant injury to other parts of the body or loss of consciousness. Was seen at [facility] ER in [place] with xrays and given a sling. No neurologic symptoms. Pain is controlled with immobilization.      Prior to this was actually seen in 2010 in our department, and was diagnosed with large chronic supraspinatus tear. Over the years she had been managing well with conservative treatment. Prior to the fall related to today's presentation had been able to elevate and use the arm without dysfunction.    Does state that she also has a right rotator cuff tear managed nonoperatively, which doesn't bother her much.    Related Information:  Handed: right handed   Work Related Problem: No  Is a lawyer involved with this problem: No    History of Present Illness   1. Location: Left Shoulder  2. Severity (1 = No Pain, 10 = Severe Pain): 2  3. Context - How did this problem begin? Fall  4. Modifying Factors:  What makes symptom(s) worse? Using affected side  Work  Exercise  Don't know  What improves your symptom(s)? Rest/immobilization    Special Questionnaires   Right Left    SANE (Single Assessment Numeric Evaluation)  100% 0%      REVIEW OF SYSTEMS   Constitutional: negative for weight gain, weight loss, fatigue, insomnia, fever and night-sweats/chills   Eyes: glasses/contacts    Ear/Nose/Throat: negative for sinus trouble, hearing loss and ringing in ears   Cardiovascular: irregular heartbeat and history of SVT- controlled with medication   Respiratory: negative for shortness of breath, difficulty breathing, lung disease and persistent cough   Gastrointestinal: negative for decreased appetite, constipation, heartburn, nausea, diarrhea and hepatitis   Musculoskeletal:  fractures   Genitourinary: negative for kidney stone, bladder/kidney infections, prostate problems and painful urinating   Skin/Integument:  negative for masses, blisters, non-healing wounds and dermatitis    Neurological:  neuropathy bilateral lower extremities, knees down   Psychiatric:  negative for anxiety, depression or other mental health issues   Endocrine:  thyroid disorder- hypothyroidism controlled on medication   Blood/Lymphatic: negative for bleeding or clotting problems, anemia and swollen or enlarged lymph nodes   Immunological: negative for HIV/AIDS, Sjgren's syndrome, scleroderma, hay fever and lupus   Cancer: negative for cancer     Other History    Social History     Occupational History   . Not on file.     Social History Main Topics   . Smoking status: Never Smoker    . Smokeless tobacco: Not on file   . Alcohol Use: No   . Drug Use: No   . Sexual Activity: Not on file     Past Medical History   Diagnosis Date   . Other and unspecified hyperlipidemia    . Sleep disturbance, unspecified    . Arthritis    . Disorder  of muscle, ligament, and fascia    . Osteoporosis    . Thyroid disease    . Irregular heart rhythm      Past Surgical History   Procedure Laterality Date   . Vaginal hysterectomy uterus 250 gm/<  1976   . Tonsillectomy & adenoidectomy <age 49  1949   . Rpr complex retina detach vitrect &membrane peel  1996   . Xcapsular cataract rmvl insj lens prosth 1 stg  2007/2010   . Unlisted procedure leg/ankle  2005     tib/fib fx   . Unlisted procedure spine  2003     left L4-L5 microdiscectomy   . Unlisted procedure foot/toes       Medication: Takes levothyroxine, asa 81, digoxin, metoprolol, vit d3    Review of patient's allergies indicates:  No Known Allergies     Family History   Problem Relation Age of Onset   . Arthritis     . Cancer     . Diabetes     . Heart Disease     . Osteoporosis     . Thyroid Disease     . Cancer Mother    . Cancer Father    . Heart Disease Father    . Stroke Sister    . Cancer Brother    . No Family Hx Maternal Grandmother    . No Family Hx Maternal Grandfather    . No Family Hx Paternal Grandmother    . No Family Hx Paternal Grandfather        PHYSICAL  EXAMINATION    BP 151/70 mmHg  Pulse 67  Temp(Src) 98.2 F (36.8 C)  Ht 5' 5.75" (1.67 m)  Wt 225 lb (102.059 kg)  BMI 36.59 kg/m2  SpO2 94%    General appearance:  Ms. Sarracino is a well developed, well nourished female in no apparent distress. Present with her niece. Ambulates with cane in right hand.    Psychological:  Her judgment, insight, memory, mood and affect appear to be within normal limits    Neurological:  She is alert and oriented x 3     Respiratory:  She is without any obvious respiratory distress    Cardiac:  Rate controlled, stable; no murmurs    Abdomen:   Soft, nontender    ENT:  She is able to hear and understand verbal questions and commands    Spine:  Cervical spine stable  Thoracic and lumbar spine stable    LEFT SHOULDER SPECIFIC EXAM  No open lesion/wound on the left shoulder/upper extremity  Shoulder motion not assessed secondary to injury state    Neurovascular exam:  Motor neural correlate Strength Testing: Left   Deltoid; axillary nerve; C5/C6 Arm abduction at shoulder  5   Biceps, brachialis; musculocutaneous nerve; C5/C6 Elbow flexion with forearm supinated 5   Triceps; radial nerve; C6/C7/C8 Elbow extension 5   Flexor carpi radialis; median nerve; C6/C7 Wrist flexion and hand abduction 5   Flexor carpi ulnaris; ulnar nerve; C7/C8/T1 Wrist flexion and hand adduction 5   Extensor carpi radialis; radial nerve; C5/C6 Wrist extension and hand abduction 5   Extensor digitorum, extensor indicis, extensor digiti minimi; radial nerve (PIN); C7/C8 Finger extension 5   Abductor pollicis longus; radial nerve (PIN); C7/C8 Thumb abduction in plane of palm 5   Dorsal interossei, abductor digiti minimi; ulnar nerve; C8/T1 Finger abduction 5   Adductor pollicis, palmar interossei; ulnar nerve; C8/T1 Finger and thumb adduction in plane of palm 5  Opponens pollicis; median nerve; C8/T1 Thumb opposition 5   Abductor pollicis brevis; median nerve; C8/T1 Thumb abduction perpendicular to plane of palm  5   Flexor digitorum profundus to digits 2,3; median nerve; C7/C8 Flexion at DIP in digit 2 and 3 5   Flexor digitorum profundus to digits 4,5; ulnar nerve; C7/C8 Flexion at DIP in digit 4 and 5 5     Sensory: sensation intact to light tough in the lateral antebrachial cutaneous, median, ulnar, and radial distributions  Vascular: hand is warm and well-perfused with a 2+ radial pulse.     IMAGING STUDIES  X-rays: Reviewed 08/22/15 xrays of the left shoulder series, which includes ap and lateral humerus films and ap, ap in ER and ap in IR of shoulder.  By my read there is a comminuted displaced fracture that extends through the glenoid fossa to the inferior and lateral border of the scapula. Humeral head is subluxed or possibly dislocated anterior and inferior.  AP and scapular y views obtained today demonstrate unchanged position of the displaced type II scapula fracture and anterior/inferior location of the humeral head    Please note that today's visit lasted more than 60 minutes, of which more than half was spent in face-to-face counseling regarding surgical and conservative management options.      Beckey Downing, PA-C  Orthopaedics and Sports Medicine  [facility]  Shoulder and Elbow Team

## 2015-09-03 ENCOUNTER — Inpatient Hospital Stay
Admission: AD | Admit: 2015-09-03 | Discharge: 2015-09-06 | DRG: 483 | Disposition: A | Discharge status: Home / Self Care | Payer: Medicare Other | Attending: Orthopaedic Surgery | Admitting: Orthopaedic Surgery

## 2015-09-03 ENCOUNTER — Inpatient Hospital Stay (HOSPITAL_COMMUNITY): Payer: Medicare Other | Admitting: Orthopaedic Surgery

## 2015-09-03 ENCOUNTER — Other Ambulatory Visit: Payer: Self-pay | Admitting: Orthopaedic Surgery

## 2015-09-03 DIAGNOSIS — Z7901 Long term (current) use of anticoagulants: Secondary | ICD-10-CM

## 2015-09-03 DIAGNOSIS — M75122 Complete rotator cuff tear or rupture of left shoulder, not specified as traumatic: Secondary | ICD-10-CM | POA: Diagnosis present

## 2015-09-03 DIAGNOSIS — I499 Cardiac arrhythmia, unspecified: Secondary | ICD-10-CM | POA: Diagnosis present

## 2015-09-03 DIAGNOSIS — S43005A Unspecified dislocation of left shoulder joint, initial encounter: Secondary | ICD-10-CM | POA: Diagnosis present

## 2015-09-03 DIAGNOSIS — G629 Polyneuropathy, unspecified: Secondary | ICD-10-CM | POA: Diagnosis present

## 2015-09-03 DIAGNOSIS — W1830XA Fall on same level, unspecified, initial encounter: Secondary | ICD-10-CM | POA: Diagnosis present

## 2015-09-03 DIAGNOSIS — S42142A Displaced fracture of glenoid cavity of scapula, left shoulder, initial encounter for closed fracture: Secondary | ICD-10-CM

## 2015-09-03 DIAGNOSIS — E039 Hypothyroidism, unspecified: Secondary | ICD-10-CM | POA: Diagnosis present

## 2015-09-03 DIAGNOSIS — Z4731 Aftercare following explantation of shoulder joint prosthesis: Secondary | ICD-10-CM

## 2015-09-03 DIAGNOSIS — G8918 Other acute postprocedural pain: Secondary | ICD-10-CM

## 2015-09-03 DIAGNOSIS — Z96612 Presence of left artificial shoulder joint: Secondary | ICD-10-CM

## 2015-09-03 DIAGNOSIS — Z7982 Long term (current) use of aspirin: Secondary | ICD-10-CM

## 2015-09-03 DIAGNOSIS — Z6836 Body mass index (BMI) 36.0-36.9, adult: Secondary | ICD-10-CM

## 2015-09-03 DIAGNOSIS — S43002A Unspecified subluxation of left shoulder joint, initial encounter: Secondary | ICD-10-CM

## 2015-09-03 DIAGNOSIS — R7303 Prediabetes: Secondary | ICD-10-CM | POA: Diagnosis present

## 2015-09-03 DIAGNOSIS — E669 Obesity, unspecified: Secondary | ICD-10-CM | POA: Diagnosis present

## 2015-09-03 DIAGNOSIS — S42143A Displaced fracture of glenoid cavity of scapula, unspecified shoulder, initial encounter for closed fracture: Principal | ICD-10-CM | POA: Diagnosis present

## 2015-09-03 LAB — GLUCOSE POC, ~~LOC~~: Glucose (POC): 100 mg/dL (ref 62–125)

## 2015-09-04 ENCOUNTER — Encounter: Payer: Medicare Other | Admitting: Rehabilitative and Restorative Service Providers"

## 2015-09-04 LAB — BASIC METABOLIC PANEL
Anion Gap: 9 (ref 4–12)
Calcium: 8.6 mg/dL — ABNORMAL LOW (ref 8.9–10.2)
Carbon Dioxide, Total: 25 meq/L (ref 22–32)
Chloride: 104 meq/L (ref 98–108)
Creatinine: 0.66 mg/dL (ref 0.38–1.02)
GFR, Calc, African American: >60 mL/min/{1.73_m2}
GFR, Calc, European American: >60 mL/min/{1.73_m2}
Glucose: 159 mg/dL — ABNORMAL HIGH (ref 62–125)
Potassium: 4.4 meq/L (ref 3.6–5.2)
Sodium: 138 meq/L (ref 135–145)
Urea Nitrogen: 12 mg/dL (ref 8–21)

## 2015-09-04 LAB — CBC (HEMOGRAM)
Hematocrit: 39 % (ref 36–45)
Hemoglobin: 12.6 g/dL (ref 11.5–15.5)
MCH: 31.1 pg (ref 27.3–33.6)
MCHC: 32.5 g/dL (ref 32.2–36.5)
MCV: 96 fL (ref 81–98)
Platelet Count: 207 10*3/uL (ref 150–400)
RBC: 4.05 10*6/uL (ref 3.80–5.00)
RDW-CV: 13 % (ref 11.6–14.4)
WBC: 11.78 10*3/uL — ABNORMAL HIGH (ref 4.3–10.0)

## 2015-09-05 LAB — CBC (HEMOGRAM)
Hematocrit: 43 % (ref 36–45)
Hemoglobin: 13.6 g/dL (ref 11.5–15.5)
MCH: 30.8 pg (ref 27.3–33.6)
MCHC: 31.6 g/dL — ABNORMAL LOW (ref 32.2–36.5)
MCV: 98 fL (ref 81–98)
Platelet Count: 231 10*3/uL (ref 150–400)
RBC: 4.41 10*6/uL (ref 3.80–5.00)
RDW-CV: 13.2 % (ref 11.6–14.4)
WBC: 10.28 10*3/uL — ABNORMAL HIGH (ref 4.3–10.0)

## 2015-09-05 LAB — BASIC METABOLIC PANEL
Anion Gap: 5 (ref 4–12)
Calcium: 8.9 mg/dL (ref 8.9–10.2)
Carbon Dioxide, Total: 31 meq/L (ref 22–32)
Chloride: 102 meq/L (ref 98–108)
Creatinine: 0.72 mg/dL (ref 0.38–1.02)
GFR, Calc, African American: >60 mL/min/{1.73_m2}
GFR, Calc, European American: >60 mL/min/{1.73_m2}
Glucose: 110 mg/dL (ref 62–125)
Potassium: 4.2 meq/L (ref 3.6–5.2)
Sodium: 138 meq/L (ref 135–145)
Urea Nitrogen: 14 mg/dL (ref 8–21)

## 2015-09-06 DIAGNOSIS — Z79891 Long term (current) use of opiate analgesic: Secondary | ICD-10-CM

## 2015-09-06 LAB — BASIC METABOLIC PANEL
Anion Gap: 5 (ref 4–12)
Calcium: 8.3 mg/dL — ABNORMAL LOW (ref 8.9–10.2)
Carbon Dioxide, Total: 31 meq/L (ref 22–32)
Chloride: 101 meq/L (ref 98–108)
Creatinine: 0.64 mg/dL (ref 0.38–1.02)
GFR, Calc, African American: >60 mL/min/{1.73_m2}
GFR, Calc, European American: >60 mL/min/{1.73_m2}
Glucose: 121 mg/dL (ref 62–125)
Potassium: 3.9 meq/L (ref 3.6–5.2)
Sodium: 137 meq/L (ref 135–145)
Urea Nitrogen: 13 mg/dL (ref 8–21)

## 2015-09-06 LAB — CBC (HEMOGRAM)
Hematocrit: 39 % (ref 36–45)
Hemoglobin: 12.6 g/dL (ref 11.5–15.5)
MCH: 31.7 pg (ref 27.3–33.6)
MCHC: 32.7 g/dL (ref 32.2–36.5)
MCV: 97 fL (ref 81–98)
Platelet Count: 221 10*3/uL (ref 150–400)
RBC: 3.97 10*6/uL (ref 3.80–5.00)
RDW-CV: 13.2 % (ref 11.6–14.4)
WBC: 10.11 10*3/uL — ABNORMAL HIGH (ref 4.3–10.0)

## 2015-09-09 ENCOUNTER — Telehealth (HOSPITAL_BASED_OUTPATIENT_CLINIC_OR_DEPARTMENT_OTHER): Payer: Self-pay

## 2015-09-09 ENCOUNTER — Telehealth (HOSPITAL_BASED_OUTPATIENT_CLINIC_OR_DEPARTMENT_OTHER): Payer: Self-pay | Admitting: Orthopaedic Surgery

## 2015-09-09 NOTE — Telephone Encounter (Signed)
(  TEXTING IS AN OPTION FOR [facility] CLINICS ONLY)  Is this a [facility] clinic? No      RETURN CALL: General message OK      SUBJECT:  General Message     REASON FOR REQUEST: Post-op questions    MESSAGE: Patient has questions regarding dressing changes for shoulder replacement. Please call patient to discuss. Thank you!

## 2015-09-09 NOTE — Telephone Encounter (Signed)
Pt is 82 yo female s/p Left reverse total shoulder arthroplasty, Left glenoid open reduction internal fixation 09/03/15 by Dr Raynald Kemp, sched 09/15/15 for f/u with De Hollingshead PA-C  Called and spoke to pt's friend Fannie Knee, pt is staying with her for 2 weeks postop, advised OK to remove surgical dressing and shower, can clean gently around incision with mild soap and water and pat dry, can leave open to air or cover with gauze and tape prn. Fannie Knee states pt is doing very well, only taking 2 pain pills per day before she does her exercises, is eating and drinking and having BMs. Fannie Knee states understanding of info given and is satisfied with plan, encouraged to call back with any other questions/concerns.

## 2015-09-15 ENCOUNTER — Encounter (HOSPITAL_BASED_OUTPATIENT_CLINIC_OR_DEPARTMENT_OTHER): Payer: Self-pay | Admitting: Physician Assistant

## 2015-09-15 ENCOUNTER — Encounter (HOSPITAL_BASED_OUTPATIENT_CLINIC_OR_DEPARTMENT_OTHER): Payer: Medicare Other | Admitting: Physician Assistant

## 2015-09-15 ENCOUNTER — Ambulatory Visit: Payer: Medicare Other | Attending: Physician Assistant | Admitting: Physician Assistant

## 2015-09-15 VITALS — BP 150/65 | HR 66 | Temp 98.0°F | Ht 65.75 in | Wt 225.0 lb

## 2015-09-15 DIAGNOSIS — S43012D Anterior subluxation of left humerus, subsequent encounter: Secondary | ICD-10-CM | POA: Insufficient documentation

## 2015-09-15 DIAGNOSIS — S42142D Displaced fracture of glenoid cavity of scapula, left shoulder, subsequent encounter for fracture with routine healing: Secondary | ICD-10-CM | POA: Insufficient documentation

## 2015-09-15 DIAGNOSIS — M75122 Complete rotator cuff tear or rupture of left shoulder, not specified as traumatic: Secondary | ICD-10-CM | POA: Insufficient documentation

## 2015-09-15 DIAGNOSIS — Z09 Encounter for follow-up examination after completed treatment for conditions other than malignant neoplasm: Secondary | ICD-10-CM | POA: Insufficient documentation

## 2015-09-15 DIAGNOSIS — Z96612 Presence of left artificial shoulder joint: Secondary | ICD-10-CM

## 2015-09-15 NOTE — Progress Notes (Signed)
[facility] Department of Orthopaedics & Sports Medicine  Shoulder And Elbow Service       [facility]; [address] [place] ; [place], [place]  [address]  [phone]; Fax:(206) [phone]    [email or web address]    Patient Name: Monique Martinez  Date of Birth: [birth year]  Medical Record #: [id number]    Primary Care Provider:  Concha Pyo, DO  Address Alert - Do Not Mail      Referring Provider:  No ref. provider found              Patient Care Team:  No providers found     PROCEDURES PERFORMED:   1.   Left reverse total shoulder arthroplasty.    2.   Left glenoid open reduction internal fixation.      DATE OF PROCEDURE: 09/03/15    INTERIM HISTORY:  Monique Martinez returns 2 weeks after the above procedure. Pain is under control, feels very comfortable.  No neurologic symptoms. No fevers, chills, chest pain, shortness of breath, nausea, or vomiting.    EXAM:  BP 150/65 mmHg  Pulse 66  Temp(Src) 98 F (36.7 C) (Temporal)  Ht 5' 5.75" (1.67 m)  Wt 225 lb (102.059 kg)  BMI 36.59 kg/m2  SpO2 98%    General appearance:  Monique Martinez is in no apparent distress.     Shoulder exam:  Wound appears clean, dry, and intact. There is no eythema, drainage, or any signs of infection.     Shoulder ROM:  Not tested today    Neurovascular exam:  The axillary, musculocutaneous, posterior interosseous, anterior interosseous, and ulnar nerves are intact to motor function. Sensation is intact to light touch in the axillary, lateral antebrachial cutaneous, median, ulnar, and radial distributions. Hand is warm and well-perfused.     IMAGING:  Not obtained today; reviewed left shoulder xrays from postoperative stay in hospital. Stable reverse shoulder arthroplasty.     Assessment  and Plan  Monique Martinez is on expected course after surgery. Clinically doing well. I encouraged the continuation of elbow, wrist, and hand range of motion, as well as pendulums. She should remain in a sling for a total of 6  weeks from the date of surgery. Stretching/range of motion will start at 6 weeks and then we will start a light strengthening program at approximately 3 months. While in the sling it's fine for her to use her hand for small tasks, but she should not be weight bearing or moving her arm away from the body. She's quite concerned about showering independently and asked about home assistance. I provided her with a list of home health aides and estimated costs for private pay, and suggested she try calling her insurance to see if there is covered home assistance for daily activities like showering. She will let us know if she needs anything from our end with regard to documentation if she does have covered aide. Encouraged her to contact us with any questions or concerns between now and the next appointment.    Pain management: continue to taper to otc NSAIDs as tolerated  Motion/therapy: as above  Follow-up: I will plan to see Monique Martinez back in about 4 weeks      Imaging at next appointment: Left shoulder ap, grashey, axillary lateral     Beckey Downing, PA-C  Orthopaedics and Sports Medicine  [facility]  Shoulder and Elbow Team

## 2015-09-15 NOTE — Patient Instructions (Signed)
It was a pleasure seeing you in clinic today.  Please let us know if you have any questions for us and we look forward to seeing at your next appointment.    If you have any questions, concerns, or would like to contact Kiel Cockerell Harvey, PA-C or Dr. Hsu directly, please feel free to e-mail me directly at [email or web address].    You can schedule an appointment to see us by calling [phone].    You may also find useful information about various orthopaedic conditions and what we do at [email or web address] .

## 2015-09-21 ENCOUNTER — Telehealth (HOSPITAL_BASED_OUTPATIENT_CLINIC_OR_DEPARTMENT_OTHER): Payer: Self-pay | Admitting: Physician Assistant

## 2015-09-21 NOTE — Telephone Encounter (Signed)
Pt is calling in asking for a letter to be written excusing the pt from jury duty.  Pt is to report to jury duty on April 3rd.    Letter can be mailed to pt address on file.    Mardelle Mattendy questions please call the pt at [phone]

## 2015-09-21 NOTE — Telephone Encounter (Signed)
Letter written; directed to patient's mailing address

## 2015-09-30 ENCOUNTER — Telehealth (HOSPITAL_BASED_OUTPATIENT_CLINIC_OR_DEPARTMENT_OTHER): Payer: Self-pay | Admitting: Physician Assistant

## 2015-09-30 NOTE — Telephone Encounter (Signed)
Pt s/p Hsu 09/03/15 L reverse total shoulder. Fell on right side onto table due to vertigo, not operative side, but having pain on left side from elbow up to neck. Feels like it was jarred. Per [name], RN instructed pt that we would like her to see a local provider to get AP, Grashey, and scapular Y xrays, but not an axillary. Pt to call back when that is done and we will push imaging into our system. Pt thanked Charity fundraiserN for call.  _________________________________  Monique Martinez, BS, MSN, RN  [facility]  [facility]

## 2015-09-30 NOTE — Telephone Encounter (Signed)
Orders for L Grashey AP, scapular Y and AP faxed to pt PCP at [phone] w/confirmation.  _________________________________  Peyton BottomsMason P. McDaniel, BS, MSN, RN  [facility]  [place] [facility]

## 2015-09-30 NOTE — Telephone Encounter (Signed)
(  TEXTING IS AN OPTION FOR [facility] CLINICS ONLY)  Is this a [facility] clinic? No      RETURN CALL: General message OK      SUBJECT:  General Message     REASON FOR REQUEST: medical questions, request to [facility]    MESSAGE: Patient had a surgery on 02/17, left shoulder. Patient fall down and patient wants to know if she needs x-ray or not.  Please call and advise.

## 2015-10-01 NOTE — Telephone Encounter (Addendum)
**Note Monique-Identified via Obfuscation** Monique Martinez, radiologist from [facility] from [place] calling to report patient had a fall 3 days ago.    Xrays were done  - no fracture  - no loosening of hardware  - arthroplasty is looks fine    Monique Martinez can be reached on his cell at [phone] if you need to talk to him    Thanks,    Monique Martinez, PSS-2  [facility]  [facility] Rheumatology    Next appointment follow up with Monique Gaudier, MD is on 04.04.2017 at 1:10PM    ===================================================================================  Monique Martinez, Monique Martinez [id number]  Operative Report Authenticated  Service Date: Feb-17-2017  Dictated by Monique Kemp MD, Monique Martinez on [phone]    OPERATIVE REPORT:   DATE OF SERVICE:   09/03/2015.   DATE OF BIRTH:   04-24-1931.   SURGEON:   Monique Gaudier, MD  ?   FIRST SURGICAL ASSISTANT:   Monique Haver, MD   SECOND SURGICAL ASSISTANT:   Monique Hollingshead, PA-C   PREOPERATIVE DIAGNOSES:   1.   Left shoulder dislocation.    2.   Glenoid fracture.    3.   Full-thickness rotator cuff tear.    POSTOPERATIVE DIAGNOSIS:   1.   Left shoulder dislocation.    2.   Glenoid fracture.    3.   Full-thickness rotator cuff tear.    PROCEDURES PERFORMED:   1.   Left reverse total shoulder arthroplasty.    2.   Left glenoid open reduction internal fixation.    ANESTHESIA:   General.   COMPLICATIONS:   None apparent.   ESTIMATED BLOOD LOSS:   200 mL.   IMPLANTS USED:   DJO standard baseplate   32 -4 glenosphere   10 humeral stem, impaction grafted   +0 standard polyethylene   INDICATIONS FOR PROCEDURE:   This 82 year old female sustained a ground- level fall about 2 weeks ago. She sustained a shoulder dislocation and x-rays also demonstrated a substantial glenoid fracture affecting 30-40% of her glenoid. Her humeral head remained subluxed due to the glenoid fracture. She already had preexisting rotator cuff issues with pain and difficulty with range of motion prior to the fall. She was diagnosed with a full-thickness tear  before her fall. Given the combination of a preexisting rotator cuff tear, as well as glenohumeral subluxation and a glenoid fracture, we discussed reverse shoulder arthroplasty and glenoid open reduction and internal fixation of the most reliable procedure to relieve pain and improve function. After discussing all the risks and benefits of surgery, the patient elected to proceed.   DESCRIPTION OF PROCEDURE:   Prior to the patient entering the Operating Room, the patient was examined in the Preoperative Holding Area and the right shoulder was marked with a marking pen. The Anesthesia Team evaluated the patient and took the patient back to the Operating Room. In the Operating Room, a formal time-out was undertaken where the patient's name, date of birth, operative extremity, and operative procedure were verified. SCDs were applied to both lower extremities. General anesthesia was then administered and the patient was placed in the beach chair position. All bony prominences were carefully padded. IV antibiotics were indicated and given. The operative extremity was prepped and draped in a normal sterile fashion.   A standard deltopectoral interval was used. A knife was used through the skin, followed by Bovie electrocautery through the subcutaneous tissues. We found the cephalic vein and took this laterally. The subdeltoid adhesions were  released and subacromial adhesions were released. We opened the clavipectoral fascia and found the biceps tendon. This was tenotomized proximally. The subscapularis was then peeled off the lesser tuberosity and then tagged with a #2 Tevdek suture. We proceeded with an inferior capsular release and then dislocated the humerus anteriorly out of the wound. She had a preexisting superior and posterior rotator cuff tear affecting the supraspinatus and anterior infraspinatus. We protected the humeral head posteriorly and then performed a humeral head cut in 45 degrees of valgus and 20  degrees of retroversion. We then removed some osteophytes with a rongeur.   Attention was then turned to the glenoid. A Bankart retractor was placed posteriorly. We identified the fractured anterior glenoid piece. A soft tissue release was performed. A Cobb elevator was used to bring the anterior glenoid back to an anatomic position and then this was provisionally fixed with 2 K-wires. This was affecting approximately 40% of the anterior glenoid. The center point of the glenoid was then found with a 2.5 drill and then the central standard DJO tap was used. This obtained good fixation. We gently used a reamer and reamed to a flat surface, ensuring that we obtained anatomic reduction over our anterior glenoid piece. The central tack was then removed and then the standard baseplate was inserted. We drilled 4 peripheral locking screw holes and then inserted locking screws into each. After we inserted all the locking screws. We had stable fixation of our baseplate and our anterior glenoid fracture. The provisional K-wire fixation was removed and the anterior glenoid space was very solid. We then impacted a 32 -4 glenosphere into place and ensured that it was rotationally stable. We then inserted a central screw into the glenosphere into the base plate.   Attention was turned back to the proximal humerus. We reamed up to a size 10 humeral canal. A size 10 trial was inserted and then we used a proximal reamer in the proximal canal to get good proximal fixation of our trial. We trialed a +0 polyethylene and found this to provide good stability and good range of motion with no impingement. The trial was removed and then we impaction grafted some humeral head autograft into the humeral canal. We then drilled 5 holes into the lesser tuberosity and passed #2 Tevdek's for future subscapularis repair. The real size 10 humeral stem was then impacted into place and it was rotationally stable. A +0 polyethylene was impacted into  place. The joint was reduced. We again took the arm through a range of motion and found there to be good range of motion with no impingement and no instability.   All sponge, instrument and needle counts were correct. We thoroughly irrigated out the entire joint with 3 liters of saline with antibiotics. Five #2 Tevdek sutures were then passed through the subscapularis from superior to inferior and then tied down to complete the subscapularis repair. A gram of vancomycin powder was placed deep and 1 gram was placed superficial. The subcutaneous tissue was closed with 2-0 Biosyn followed by staples on the skin. A sterile dressing was placed on top. The patient's arm was placed in a sling. She was awakened from anesthesia and taken to the Post-Anesthesia care unit awake, alert, and with stable vital signs.   POSTOP REHABILITATION PROTOCOL:   We will have the patient start elbow, wrist, and hand range of motion immediately after surgery. Pendulums will start at 2 weeks. She should remain in a sling for a total of 6  weeks. More aggressive range of motion will start at 6 weeks and then we will start strengthening at approximately 3 months.   PRESENCE STATEMENT:   I was present for the entire procedure from skin incision through wound closure.   Please note the presence of Dr. Sarita Haveraniel Hackett was necessary given that there were no Residents available to assist in the procedure.   Signature Line   Electronically Reviewed/Signed On: 09/08/15 at 16:12  _______________________________________  Monique KempHsu MD, Monique GerlachJason E  Orthopaedics and Sports Medicine  [address] [place]  [place] [id number]                JEH/TUI  DD:09/04/15  TD:09/06/15      [phone]    CC Address Information  none

## 2015-10-19 ENCOUNTER — Ambulatory Visit: Payer: Medicare Other | Attending: Orthopaedic Surgery | Admitting: Orthopaedic Surgery

## 2015-10-19 ENCOUNTER — Encounter (HOSPITAL_BASED_OUTPATIENT_CLINIC_OR_DEPARTMENT_OTHER): Payer: Self-pay | Admitting: Orthopaedic Surgery

## 2015-10-19 VITALS — BP 126/62 | HR 89 | Temp 98.7°F | Ht 65.75 in | Wt 225.0 lb

## 2015-10-19 DIAGNOSIS — M19012 Primary osteoarthritis, left shoulder: Secondary | ICD-10-CM | POA: Insufficient documentation

## 2015-10-19 DIAGNOSIS — Z96612 Presence of left artificial shoulder joint: Secondary | ICD-10-CM | POA: Insufficient documentation

## 2015-10-19 NOTE — Progress Notes (Signed)
ATTENDING NOTE    I saw and evaluated the patient and agree with Kirsten Harvey's note. I personally reviewed the note and agree with the documented findings and plan of care.    Worley Radermacher E Shanae Luo, MD  Orthopaedics and Sports Medicine  [place] [facility]   Shoulder and Elbow Team

## 2015-10-19 NOTE — Progress Notes (Signed)
[place] [facility] [place] [facility] of Orthopaedics & Sports Medicine  Shoulder And Elbow Service       [facility]; [address] [place] ; [place], [place]  [id number]  [phone]; Fax:(206) [phone]    [email or web address]    Patient Name: Monique Martinez  Date of Birth: October 22, 1930  Medical Record #: [id number]    Primary Care Provider:  Concha PyoLarry D Bounds, DO  Address Alert - Do Not Mail      Referring Provider:  No ref. provider found              Patient Care Team:  No providers found     PROCEDURES PERFORMED:   1.   Left reverse total shoulder arthroplasty.    2.   Left glenoid open reduction internal fixation.      DATE OF PROCEDURE: 09/03/15    INTERIM HISTORY:  Monique Martinez returns 6 weeks after the above procedure. Comfortable. After her last visit, in early March, fell. Was light headed after standing up, was wearing her sling, but fell and was uncertain of how exactly she landed. Had some burning in the upper arm, and around the incision. Had xrays done locally which were reportedly stable; and the burning has since resolved. No present neurologic symptoms.     EXAM:  BP 126/62 mmHg  Pulse 89  Temp(Src) 98.7 F (37.1 C) (Temporal)  Ht 5' 5.75" (1.67 m)  Wt 225 lb (102.059 kg)  BMI 36.59 kg/m2  SpO2 94%    General appearance:  Monique Martinez is in no apparent distress.     Shoulder exam:  Wound appears clean, dry, and intact. There is no eythema, drainage, or any signs of infection.     Shoulder ROM:  Supine assisted elevation 80, passive external rotation 30, internal rotation gluteus    Neurovascular exam:  The axillary, musculocutaneous, posterior interosseous, anterior interosseous, and ulnar nerves are intact to motor function. Sensation is intact to light touch in the axillary, lateral antebrachial cutaneous, median, ulnar, and radial distributions. Hand is warm and well-perfused.     IMAGING:  Left shoulder ap, grashey, axillary lateral view obtained today demonstrate  satisfactory position of reverse TSA. Glenoid stable s/p ORIF    Assessment  and Plan  Monique Martinez is on expected course after left reverse shoulder arthroplasty and ORIF glenoid. Clinically doing well. Discontinue sling and resume light daily activity only. Cautioned against overuse, repetitive use, or overhead activity. Begin to progress range of motion further with stretching. Demonstrated supine self assisted forward elevation today. She will also begin to work on assisted ER and IR. Encouraged PCP follow up to address light headedness/falls.    Pain management: continue to taper to otc NSAIDs as tolerated  Motion/therapy: Referral given specifying the following protocol    PHASE II: Progressive range of motion (4 to 8 weeks)   May discontinue sling. Lifting restriction of 5 pounds should be reinforced with patient.   At 4 weeks after surgery start:    Progress supine active assist forward elevation to 150 degrees    Pulleys    Passive and active external rotation as tolerated    Isolate and strengthen scapular stabilizers.   The patient should AVOID the following motions:    Do not reach behind the back to pull up pants, perform toileting, etc. with the surgical arm until 6 weeks.    Do not put weight through the surgical arm, such as pushing up from a  chair or lifting anything over 5 pounds.   Between 8-12 weeks continue to optimize ROM, stretching in forward elevation, external rotation, cross body adduction. Begin standing assisted internal rotation. May start light deltoid strengthening/isometrics and eccentric at 8 weeks. Continue to emphasize scapular stabilizers.     PHASE III: (>12 weeks)   Lifting restriction of 5 pounds should be reinforced with patient. Okay to begin therabands   May start internal rotation movements.   Equate active and passive range of motion.   Encourage scapulohumeral mechanics during active shoulder motion.   Simulate work/recreational activities as rotator cuff strength and  endurance improve at 3 months.    Follow-up: I will plan to see Monique Martinez back in 2 months      Imaging at next appointment: Left shoulder ap, grashey, axillary lateral     Beckey Downing, PA-C  Orthopaedics and Sports Medicine  [facility]  Shoulder and Elbow Team

## 2015-10-19 NOTE — Patient Instructions (Signed)
It was a pleasure seeing you in clinic today.  Please let us know if you have any questions for us and we look forward to seeing at your next appointment.    If you have any questions, concerns, or would like to contact Dr. Amory Simonetti directly, please feel free to e-mail me directly at [email or web address].    You can schedule an appointment to see us by calling [phone].    You may also find useful information about various orthopaedic conditions and what we do at [email or web address] .

## 2015-11-05 ENCOUNTER — Other Ambulatory Visit: Payer: Self-pay

## 2015-11-05 DIAGNOSIS — Z1231 Encounter for screening mammogram for malignant neoplasm of breast: Secondary | ICD-10-CM

## 2015-12-06 ENCOUNTER — Ambulatory Visit
Admission: RE | Admit: 2015-12-06 | Discharge: 2015-12-06 | Disposition: A | Discharge status: Home / Self Care | Payer: Medicare Other | Source: Ambulatory Visit

## 2015-12-06 DIAGNOSIS — Z1231 Encounter for screening mammogram for malignant neoplasm of breast: Secondary | ICD-10-CM

## 2015-12-21 ENCOUNTER — Ambulatory Visit: Payer: Medicare Other | Attending: Orthopaedic Surgery | Admitting: Orthopaedic Surgery

## 2015-12-21 ENCOUNTER — Encounter (HOSPITAL_BASED_OUTPATIENT_CLINIC_OR_DEPARTMENT_OTHER): Payer: Self-pay | Admitting: Orthopaedic Surgery

## 2015-12-21 VITALS — BP 129/57 | HR 62 | Temp 97.5°F | Ht 65.75 in | Wt 229.2 lb

## 2015-12-21 DIAGNOSIS — Z5189 Encounter for other specified aftercare: Secondary | ICD-10-CM

## 2015-12-21 DIAGNOSIS — M19012 Primary osteoarthritis, left shoulder: Secondary | ICD-10-CM | POA: Insufficient documentation

## 2015-12-21 DIAGNOSIS — Z96612 Presence of left artificial shoulder joint: Secondary | ICD-10-CM | POA: Insufficient documentation

## 2015-12-21 NOTE — Patient Instructions (Signed)
It was a pleasure seeing you in clinic today.  Please let us know if you have any questions for us and we look forward to seeing at your next appointment.    If you have any questions, concerns, or would like to contact Dr. Kathye Cipriani directly, please feel free to e-mail me directly at [email or web address].    You can schedule an appointment to see us by calling [phone].    You may also find useful information about various orthopaedic conditions and what we do at [email or web address] .

## 2015-12-21 NOTE — Progress Notes (Signed)
[place] [facility] [place] [facility] of Orthopaedics & Sports Medicine  Shoulder And Elbow Service       [facility]; [address] [place] ; [place], [place]  [id number]  Phone:(206) [phone]; Fax:(206) [phone]    [email or web address]    Patient Name: Monique Martinez  Date of Birth: January 05, 1931  Medical Record #: [id number]    Primary Care Provider:  Concha PyoLarry D Gaffey, DO  Address Alert - Do Not Mail      Referring Provider:  No ref. provider found              Patient Care Team:  No providers found     PROCEDURES PERFORMED:   1.   Left reverse total shoulder arthroplasty.    2.   Left glenoid open reduction internal fixation.      DATE OF PROCEDURE: 09/03/15    INTERIM HISTORY:  Monique Martinez returns 3 months after the above procedure. She is doing well with minimal pain. She is able to elevate her arm above her shoulder. Happy with procedure.    EXAM:  BP 129/57   Pulse 62   Temp 97.5 F (36.4 C) (Temporal)   Ht 5' 5.75" (1.67 m)   Wt (!) 229 lb 3.2 oz (104 kg)   SpO2 94%   BMI 37.28 kg/m     General appearance:  Monique Martinez is in no apparent distress.     Shoulder exam:  Wound appears clean, dry, and intact. There is no eythema, drainage, or any signs of infection.     Shoulder ROM:  aFE 110  ER 30  IR to L5    Neurovascular exam:  The axillary, musculocutaneous, posterior interosseous, anterior interosseous, and ulnar nerves are intact to motor function. Sensation is intact to light touch in the axillary, lateral antebrachial cutaneous, median, ulnar, and radial distributions. Hand is warm and well-perfused.     IMAGING:  Left shoulder ap, grashey, axillary lateral view obtained today demonstrate satisfactory position of reverse TSA. Glenoid stable s/p ORIF    Assessment  and Plan   Monique Martinez is on expected course after left reverse shoulder arthroplasty and ORIF glenoid. Clinically doing well - continue stretching and light deltoid strengthening.     PHASE III: (>12 weeks)   Lifting restriction of  5 pounds should be reinforced with patient. Okay to begin therabands   May start internal rotation movements.   Equate active and passive range of motion.   Encourage scapulohumeral mechanics during active shoulder motion.   Simulate work/recreational activities as rotator cuff strength and endurance improve at 3 months.    Follow-up: I will plan to see Monique Martinez back at 1 year after surgery     Imaging at next appointment: Left shoulder ap, grashey, axillary lateral     Judd GaudierJason Cardale Dorer  Orthopaedics and Sports Medicine  [facility]  Shoulder and Elbow Team

## 2016-08-21 ENCOUNTER — Telehealth (HOSPITAL_BASED_OUTPATIENT_CLINIC_OR_DEPARTMENT_OTHER): Payer: Self-pay | Admitting: Orthopaedic Surgery

## 2016-08-21 NOTE — Telephone Encounter (Signed)
(  TEXTING IS AN OPTION FOR [facility] CLINICS ONLY)  Is this a [facility] clinic? No      RETURN CALL: Detailed message on voicemail only      SUBJECT:  General Message     REASON FOR REQUEST: Patient is wondering if she needs to come back during February or June.     MESSAGE: The patient is not sure if the 1 year from the time of the surgery or the time from her last appointment in June. Thank you very much.

## 2016-08-22 NOTE — Telephone Encounter (Signed)
Routing to Kirsten and Dr. Raynald KempHsu.  Please advise.  Thanks!    Sammie BenchHolly Brown, PSS/PCC Supervisor  [facility]  [facility]   Mailstop [id number]  [address] [place]   [place], [place] [id number]   2nd Floor  [email or web address]   (ph) [phone](206) [phone] (fax) [phone]

## 2016-08-22 NOTE — Telephone Encounter (Signed)
1 year after surgery which is about now. thanks

## 2016-08-23 NOTE — Telephone Encounter (Signed)
Patient is scheduled.  Thanks!    Holly Brown, PSS/PCC Supervisor  [facility] [place]  [facility] [place]  Mailstop [id number]  [address] [place] [address]  [address]nd Floor  [email or web address]  (ph) [phone] (fax) [phone]

## 2016-09-01 ENCOUNTER — Telehealth (HOSPITAL_BASED_OUTPATIENT_CLINIC_OR_DEPARTMENT_OTHER): Payer: Self-pay | Admitting: Orthopaedic Surgery

## 2016-09-01 NOTE — Telephone Encounter (Signed)
Routing to Dr. Hsu and Kirsten.  Thanks!    Holly Brown, PSS/PCC Supervisor  [facility] [place]  [facility] [place]  Mailstop [id number]  [address] [place] [address]  [address]nd Floor  [email or web address]  (ph) [phone] (fax) [phone]

## 2016-09-01 NOTE — Telephone Encounter (Signed)
(  TEXTING IS AN OPTION FOR [facility] CLINICS ONLY)  Is this a [facility] clinic? No    RETURN CALL: OK to leave detailed message with anyone that answers    SUBJECT:  General Message     REASON FOR REQUEST: Canceled appointment for 09/18/16 - Check if needed     MESSAGE: patient wanted to let Dr. Raynald KempHsu know that she does not have transportation to make it to they 1 year follow up from shoulder surgery appointment on 09/18/16. She indicated that she feels great, her shoulder is doing is fantastic, and wanted to find out if it was absolutely necessary for her to have the appointment.    [place], and traveling over the past is not easy for patient.

## 2016-09-06 NOTE — Telephone Encounter (Signed)
Phone just rings and rings, unable to leave message.  Dr. Raynald KempHsu said if she is feeling ok, it is ok that she does not make the trip in.  Thanks!    Sammie BenchHolly Brown, PSS/PCC Supervisor  [facility]  [facility]   Mailstop [id number]  [address] [place]   [place], [place] [id number]   2nd Floor  [email or web address]   (ph) [phone] (fax) [phone]

## 2016-09-18 ENCOUNTER — Encounter (HOSPITAL_BASED_OUTPATIENT_CLINIC_OR_DEPARTMENT_OTHER): Payer: Medicare Other | Admitting: Orthopaedic Surgery

## 2016-10-31 ENCOUNTER — Other Ambulatory Visit: Payer: Self-pay | Admitting: Family Medicine

## 2016-10-31 DIAGNOSIS — Z1231 Encounter for screening mammogram for malignant neoplasm of breast: Secondary | ICD-10-CM

## 2016-12-06 ENCOUNTER — Ambulatory Visit
Admission: RE | Admit: 2016-12-06 | Discharge: 2016-12-06 | Disposition: A | Discharge status: Home / Self Care | Payer: Medicare Other | Source: Ambulatory Visit | Attending: Family Medicine | Admitting: Family Medicine

## 2016-12-06 DIAGNOSIS — Z1231 Encounter for screening mammogram for malignant neoplasm of breast: Secondary | ICD-10-CM

## 2017-09-20 ENCOUNTER — Other Ambulatory Visit (HOSPITAL_BASED_OUTPATIENT_CLINIC_OR_DEPARTMENT_OTHER): Payer: Self-pay | Admitting: Family Medicine

## 2017-09-20 DIAGNOSIS — S32020A Wedge compression fracture of second lumbar vertebra, initial encounter for closed fracture: Secondary | ICD-10-CM

## 2017-09-25 ENCOUNTER — Ambulatory Visit
Admission: RE | Admit: 2017-09-25 | Discharge: 2017-09-25 | Disposition: A | Discharge status: Home / Self Care | Payer: Medicare Other | Source: Ambulatory Visit | Attending: Family Medicine | Admitting: Family Medicine

## 2017-09-25 DIAGNOSIS — S32020A Wedge compression fracture of second lumbar vertebra, initial encounter for closed fracture: Secondary | ICD-10-CM

## 2017-10-10 ENCOUNTER — Ambulatory Visit
Admission: RE | Admit: 2017-10-10 | Discharge: 2017-10-10 | Disposition: A | Discharge status: Home / Self Care | Payer: Medicare Other | Source: Ambulatory Visit | Attending: Nephrology | Admitting: Nephrology

## 2017-10-10 ENCOUNTER — Other Ambulatory Visit: Payer: Self-pay | Admitting: Nephrology

## 2017-10-10 DIAGNOSIS — E871 Hypo-osmolality and hyponatremia: Secondary | ICD-10-CM

## 2017-11-12 ENCOUNTER — Other Ambulatory Visit: Payer: Self-pay | Admitting: Family Medicine

## 2017-11-12 ENCOUNTER — Ambulatory Visit
Admission: RE | Admit: 2017-11-12 | Discharge: 2017-11-12 | Disposition: A | Discharge status: Home / Self Care | Payer: Medicare Other | Source: Ambulatory Visit | Attending: Family Medicine | Admitting: Family Medicine

## 2017-11-12 DIAGNOSIS — W19XXXA Unspecified fall, initial encounter: Secondary | ICD-10-CM

## 2017-11-12 DIAGNOSIS — M542 Cervicalgia: Secondary | ICD-10-CM

## 2018-01-22 ENCOUNTER — Other Ambulatory Visit: Payer: Self-pay | Admitting: Family Medicine

## 2018-01-22 DIAGNOSIS — Z1231 Encounter for screening mammogram for malignant neoplasm of breast: Secondary | ICD-10-CM

## 2018-02-15 ENCOUNTER — Ambulatory Visit
Admission: RE | Admit: 2018-02-15 | Discharge: 2018-02-15 | Disposition: A | Discharge status: Home / Self Care | Payer: Medicare Other | Source: Ambulatory Visit | Attending: Family Medicine | Admitting: Family Medicine

## 2018-02-15 DIAGNOSIS — Z1231 Encounter for screening mammogram for malignant neoplasm of breast: Secondary | ICD-10-CM

## 2018-03-10 ENCOUNTER — Inpatient Hospital Stay: Payer: Self-pay

## 2018-06-05 ENCOUNTER — Other Ambulatory Visit: Payer: Self-pay | Admitting: Family Medicine

## 2018-06-05 DIAGNOSIS — M810 Age-related osteoporosis without current pathological fracture: Secondary | ICD-10-CM

## 2018-06-07 ENCOUNTER — Ambulatory Visit
Admission: RE | Admit: 2018-06-07 | Discharge: 2018-06-07 | Disposition: A | Discharge status: Home / Self Care | Payer: Medicare Other | Source: Ambulatory Visit | Attending: Family Medicine | Admitting: Family Medicine

## 2018-06-07 DIAGNOSIS — M810 Age-related osteoporosis without current pathological fracture: Secondary | ICD-10-CM

## 2019-03-21 ENCOUNTER — Other Ambulatory Visit: Payer: Self-pay | Admitting: Family Medicine

## 2019-03-21 DIAGNOSIS — Z1231 Encounter for screening mammogram for malignant neoplasm of breast: Secondary | ICD-10-CM

## 2019-05-08 ENCOUNTER — Ambulatory Visit
Admission: RE | Admit: 2019-05-08 | Discharge: 2019-05-08 | Disposition: A | Discharge status: Home / Self Care | Payer: Medicare Other | Source: Ambulatory Visit | Attending: Family Medicine | Admitting: Family Medicine

## 2019-05-08 ENCOUNTER — Other Ambulatory Visit: Payer: Self-pay

## 2019-05-08 DIAGNOSIS — Z1231 Encounter for screening mammogram for malignant neoplasm of breast: Secondary | ICD-10-CM

## 2019-05-12 ENCOUNTER — Other Ambulatory Visit: Payer: Self-pay | Admitting: Family Medicine

## 2019-05-12 DIAGNOSIS — R928 Other abnormal and inconclusive findings on diagnostic imaging of breast: Secondary | ICD-10-CM

## 2019-05-20 ENCOUNTER — Other Ambulatory Visit: Payer: Self-pay

## 2019-05-20 ENCOUNTER — Ambulatory Visit
Admission: RE | Admit: 2019-05-20 | Discharge: 2019-05-20 | Disposition: A | Discharge status: Home / Self Care | Payer: Medicare Other | Source: Ambulatory Visit | Attending: Family Medicine | Admitting: Family Medicine

## 2019-05-20 DIAGNOSIS — R928 Other abnormal and inconclusive findings on diagnostic imaging of breast: Secondary | ICD-10-CM

## 2019-07-28 ENCOUNTER — Other Ambulatory Visit: Payer: Self-pay | Admitting: Family Medicine

## 2019-07-28 DIAGNOSIS — N6489 Other specified disorders of breast: Secondary | ICD-10-CM

## 2019-08-30 ENCOUNTER — Ambulatory Visit: Payer: Medicare Other | Attending: Internal Medicine

## 2019-08-30 DIAGNOSIS — Z23 Encounter for immunization: Secondary | ICD-10-CM | POA: Insufficient documentation

## 2019-08-30 NOTE — Progress Notes (Signed)
   Covid-19 Vaccination Clinic  Name:  Tracy Bean    MRN: [id number] DOB: 24-Aug-1930  08/30/2019  [name] was observed post Covid-19 immunization for 15 minutes without incidence. She was provided with Vaccine Information Sheet and instruction to access the V-Safe system.   [name] was instructed to call 911 with any severe reactions post vaccine: Marland Kitchen Difficulty breathing  . Swelling of your face and throat  . A fast heartbeat  . A bad rash all over your body  . Dizziness and weakness    Immunizations Administered    Name Date Dose VIS Date Route   Pfizer COVID-19 Vaccine 08/30/2019  2:32 PM 0.3 mL 06/27/2019 Intramuscular   Manufacturer: [place]   Lot: [id number]   [place]: [id number]

## 2019-09-22 ENCOUNTER — Ambulatory Visit: Payer: Medicare Other | Attending: Internal Medicine

## 2019-09-22 DIAGNOSIS — Z23 Encounter for immunization: Secondary | ICD-10-CM | POA: Insufficient documentation

## 2019-09-22 NOTE — Progress Notes (Signed)
   Covid-19 Vaccination Clinic  Name:  Tracy Bean    MRN: [id number] DOB: August 17, 1930  09/22/2019  [name] was observed post Covid-19 immunization for 15 minutes without incident. She was provided with Vaccine Information Sheet and instruction to access the V-Safe system.   [name] was instructed to call 911 with any severe reactions post vaccine: Marland Kitchen Difficulty breathing  . Swelling of face and throat  . A fast heartbeat  . A bad rash all over body  . Dizziness and weakness   Immunizations Administered    Name Date Dose VIS Date Route   Pfizer COVID-19 Vaccine 09/22/2019  1:25 PM 0.3 mL 06/27/2019 Intramuscular   Manufacturer: [place]   Lot: [id number]   [place]: [id number]

## 2019-11-18 ENCOUNTER — Other Ambulatory Visit: Payer: Self-pay

## 2019-11-18 ENCOUNTER — Other Ambulatory Visit: Payer: Self-pay | Admitting: Family Medicine

## 2019-11-18 ENCOUNTER — Ambulatory Visit
Admission: RE | Admit: 2019-11-18 | Discharge: 2019-11-18 | Disposition: A | Discharge status: Home / Self Care | Payer: Medicare Other | Source: Ambulatory Visit | Attending: Family Medicine | Admitting: Family Medicine

## 2019-11-18 ENCOUNTER — Ambulatory Visit: Payer: Medicare Other

## 2019-11-18 DIAGNOSIS — N6489 Other specified disorders of breast: Secondary | ICD-10-CM

## 2020-05-25 ENCOUNTER — Other Ambulatory Visit: Payer: Self-pay

## 2020-05-25 ENCOUNTER — Ambulatory Visit
Admission: RE | Admit: 2020-05-25 | Discharge: 2020-05-25 | Disposition: A | Discharge status: Home / Self Care | Payer: Medicare Other | Source: Ambulatory Visit | Attending: Family Medicine | Admitting: Family Medicine

## 2020-05-25 ENCOUNTER — Other Ambulatory Visit: Payer: Self-pay | Admitting: Family Medicine

## 2020-05-25 DIAGNOSIS — N6489 Other specified disorders of breast: Secondary | ICD-10-CM

## 2020-08-04 DIAGNOSIS — I10 Essential (primary) hypertension: Secondary | ICD-10-CM | POA: Diagnosis not present

## 2020-08-04 DIAGNOSIS — N189 Chronic kidney disease, unspecified: Secondary | ICD-10-CM | POA: Diagnosis not present

## 2020-08-04 DIAGNOSIS — E782 Mixed hyperlipidemia: Secondary | ICD-10-CM | POA: Diagnosis not present

## 2020-08-04 DIAGNOSIS — M810 Age-related osteoporosis without current pathological fracture: Secondary | ICD-10-CM | POA: Diagnosis not present

## 2020-08-16 DIAGNOSIS — H905 Unspecified sensorineural hearing loss: Secondary | ICD-10-CM | POA: Diagnosis not present

## 2020-08-19 DIAGNOSIS — Z961 Presence of intraocular lens: Secondary | ICD-10-CM | POA: Diagnosis not present

## 2020-08-19 DIAGNOSIS — H52203 Unspecified astigmatism, bilateral: Secondary | ICD-10-CM | POA: Diagnosis not present

## 2020-08-19 DIAGNOSIS — D3131 Benign neoplasm of right choroid: Secondary | ICD-10-CM | POA: Diagnosis not present

## 2020-08-19 DIAGNOSIS — H04123 Dry eye syndrome of bilateral lacrimal glands: Secondary | ICD-10-CM | POA: Diagnosis not present

## 2020-08-23 DIAGNOSIS — R1013 Epigastric pain: Secondary | ICD-10-CM | POA: Diagnosis not present

## 2020-09-17 DIAGNOSIS — M1990 Unspecified osteoarthritis, unspecified site: Secondary | ICD-10-CM | POA: Diagnosis not present

## 2020-09-17 DIAGNOSIS — E782 Mixed hyperlipidemia: Secondary | ICD-10-CM | POA: Diagnosis not present

## 2020-09-17 DIAGNOSIS — I10 Essential (primary) hypertension: Secondary | ICD-10-CM | POA: Diagnosis not present

## 2020-09-17 DIAGNOSIS — N189 Chronic kidney disease, unspecified: Secondary | ICD-10-CM | POA: Diagnosis not present

## 2020-09-17 DIAGNOSIS — M810 Age-related osteoporosis without current pathological fracture: Secondary | ICD-10-CM | POA: Diagnosis not present

## 2020-10-28 ENCOUNTER — Encounter: Payer: Self-pay | Admitting: Skilled Nursing Facility1

## 2020-10-28 ENCOUNTER — Encounter: Payer: Medicare Other | Attending: Family Medicine | Admitting: Skilled Nursing Facility1

## 2020-10-28 ENCOUNTER — Other Ambulatory Visit: Payer: Self-pay

## 2020-10-28 DIAGNOSIS — N189 Chronic kidney disease, unspecified: Secondary | ICD-10-CM | POA: Diagnosis not present

## 2020-10-28 NOTE — Progress Notes (Signed)
Medical Nutrition Therapy  Appointment Start time:  2:00  Appointment End time:  2:45  Primary concerns today: to lose weight  Referral diagnosis: weight gain, CKD Preferred learning style: auditory Learning readiness: ready   NUTRITION ASSESSMENT   Clinical Medical Hx: HTN, Hyperlidemia Medications: see list Labs: reviewed WNL Notable Signs/Symptoms: none stated  Lifestyle & Dietary Hx  Pt states she has always been in the 120's but gained weight due to covid19 and a more sedentary lifestyle.  Pt states he lives with her husband.  Pt states after realizing she gained weight she cut back on nuts, wine, and increased her activity.   Estimated daily fluid intake: 30 oz water alone Supplements: calcium Sleep: decent  Stress / self-care: pretty relaxed  Current average weekly physical activity: 3 days a week walking 30 minutes and using the stairs:   24-Hr Dietary Recall First Meal: coffee cereal + fruit or toast + 2 eggs + 2 bacon Snack:  Second Meal: salad + yogurt + crackers or half sandwich or homemade soup + crackers + cheese  Snack: nuts or candy Third Meal: salmon, kale salad, beans Snack: Beverages: coffee, water, diet soda, 4 ounces wine   Estimated Energy Needs Calories: 1300-1500   NUTRITION INTERVENTION  Nutrition education (E-1) on the following topics:  . Reduced risk of morbidity/mortality in the older population when appropriate weight   Goals: -limit to one cup of cereal using a measuring cup  -Limit after lunch snack to 1 1/4 cup of nuts and 1 piece of chocolate  -Get back into your walking habit and do a video maybe 2 days a week -Do resistance 2 days a week -You are doing great!  Handouts Provided Include   N/A  Learning Style & Readiness for Change Teaching method utilized: Visual & Auditory  Demonstrated degree of understanding via: Teach Back  Barriers to learning/adherence to lifestyle change: none identified    MONITORING &  EVALUATION Dietary intake, weekly physical activity,  Next Steps  Patient is to call if needing any further support

## 2021-02-09 DIAGNOSIS — E782 Mixed hyperlipidemia: Secondary | ICD-10-CM | POA: Diagnosis not present

## 2021-02-09 DIAGNOSIS — N189 Chronic kidney disease, unspecified: Secondary | ICD-10-CM | POA: Diagnosis not present

## 2021-02-09 DIAGNOSIS — I10 Essential (primary) hypertension: Secondary | ICD-10-CM | POA: Diagnosis not present

## 2021-02-09 DIAGNOSIS — M1990 Unspecified osteoarthritis, unspecified site: Secondary | ICD-10-CM | POA: Diagnosis not present

## 2021-02-09 DIAGNOSIS — M810 Age-related osteoporosis without current pathological fracture: Secondary | ICD-10-CM | POA: Diagnosis not present

## 2021-03-08 DIAGNOSIS — Z20822 Contact with and (suspected) exposure to covid-19: Secondary | ICD-10-CM | POA: Diagnosis not present

## 2021-03-08 DIAGNOSIS — Z03818 Encounter for observation for suspected exposure to other biological agents ruled out: Secondary | ICD-10-CM | POA: Diagnosis not present

## 2021-03-08 DIAGNOSIS — J00 Acute nasopharyngitis [common cold]: Secondary | ICD-10-CM | POA: Diagnosis not present

## 2021-03-11 DIAGNOSIS — R6883 Chills (without fever): Secondary | ICD-10-CM | POA: Diagnosis not present

## 2021-03-11 DIAGNOSIS — U071 COVID-19: Secondary | ICD-10-CM | POA: Diagnosis not present

## 2021-03-11 DIAGNOSIS — R051 Acute cough: Secondary | ICD-10-CM | POA: Diagnosis not present

## 2021-04-05 DIAGNOSIS — M1990 Unspecified osteoarthritis, unspecified site: Secondary | ICD-10-CM | POA: Diagnosis not present

## 2021-04-05 DIAGNOSIS — N189 Chronic kidney disease, unspecified: Secondary | ICD-10-CM | POA: Diagnosis not present

## 2021-04-05 DIAGNOSIS — M810 Age-related osteoporosis without current pathological fracture: Secondary | ICD-10-CM | POA: Diagnosis not present

## 2021-04-05 DIAGNOSIS — E782 Mixed hyperlipidemia: Secondary | ICD-10-CM | POA: Diagnosis not present

## 2021-04-05 DIAGNOSIS — I10 Essential (primary) hypertension: Secondary | ICD-10-CM | POA: Diagnosis not present

## 2021-04-27 ENCOUNTER — Other Ambulatory Visit: Payer: Self-pay | Admitting: Family Medicine

## 2021-04-27 DIAGNOSIS — N6489 Other specified disorders of breast: Secondary | ICD-10-CM

## 2021-06-02 ENCOUNTER — Ambulatory Visit
Admission: RE | Admit: 2021-06-02 | Discharge: 2021-06-02 | Disposition: A | Discharge status: Home / Self Care | Payer: Medicare Other | Source: Ambulatory Visit | Attending: Family Medicine | Admitting: Family Medicine

## 2021-06-02 ENCOUNTER — Ambulatory Visit: Payer: Medicare Other

## 2021-06-02 DIAGNOSIS — N6489 Other specified disorders of breast: Secondary | ICD-10-CM

## 2021-06-02 DIAGNOSIS — R922 Inconclusive mammogram: Secondary | ICD-10-CM | POA: Diagnosis not present

## 2021-06-20 DIAGNOSIS — Z0000 Encounter for general adult medical examination without abnormal findings: Secondary | ICD-10-CM | POA: Diagnosis not present

## 2021-06-20 DIAGNOSIS — L299 Pruritus, unspecified: Secondary | ICD-10-CM | POA: Diagnosis not present

## 2021-06-20 DIAGNOSIS — E782 Mixed hyperlipidemia: Secondary | ICD-10-CM | POA: Diagnosis not present

## 2021-06-20 DIAGNOSIS — M810 Age-related osteoporosis without current pathological fracture: Secondary | ICD-10-CM | POA: Diagnosis not present

## 2021-06-20 DIAGNOSIS — D72821 Monocytosis (symptomatic): Secondary | ICD-10-CM | POA: Diagnosis not present

## 2021-08-22 DIAGNOSIS — H524 Presbyopia: Secondary | ICD-10-CM | POA: Diagnosis not present

## 2021-08-22 DIAGNOSIS — H04123 Dry eye syndrome of bilateral lacrimal glands: Secondary | ICD-10-CM | POA: Diagnosis not present

## 2021-08-22 DIAGNOSIS — Z961 Presence of intraocular lens: Secondary | ICD-10-CM | POA: Diagnosis not present

## 2021-11-02 DIAGNOSIS — N2581 Secondary hyperparathyroidism of renal origin: Secondary | ICD-10-CM | POA: Diagnosis not present

## 2021-11-02 DIAGNOSIS — N182 Chronic kidney disease, stage 2 (mild): Secondary | ICD-10-CM | POA: Diagnosis not present

## 2021-11-08 DIAGNOSIS — N2581 Secondary hyperparathyroidism of renal origin: Secondary | ICD-10-CM | POA: Diagnosis not present

## 2021-11-08 DIAGNOSIS — N182 Chronic kidney disease, stage 2 (mild): Secondary | ICD-10-CM | POA: Diagnosis not present

## 2021-11-08 DIAGNOSIS — E871 Hypo-osmolality and hyponatremia: Secondary | ICD-10-CM | POA: Diagnosis not present

## 2021-11-08 DIAGNOSIS — D631 Anemia in chronic kidney disease: Secondary | ICD-10-CM | POA: Diagnosis not present

## 2021-11-08 DIAGNOSIS — I129 Hypertensive chronic kidney disease with stage 1 through stage 4 chronic kidney disease, or unspecified chronic kidney disease: Secondary | ICD-10-CM | POA: Diagnosis not present

## 2021-11-29 DIAGNOSIS — M170 Bilateral primary osteoarthritis of knee: Secondary | ICD-10-CM | POA: Diagnosis not present

## 2022-01-17 IMAGING — MG DIGITAL DIAGNOSTIC BILAT W/ TOMO W/ CAD
6 of 10 series · 6 of 30 positions shown · non-contrast
Comparison: Previous exam(s).

CLINICAL DATA: Follow-up for a probably benign asymmetry in the
right breast, initially assessed in May 2019.

EXAM:
DIGITAL DIAGNOSTIC BILATERAL MAMMOGRAM WITH TOMOSYNTHESIS AND CAD
TECHNIQUE: Bilateral digital diagnostic mammography and breast tomosynthesis
was performed. The images were evaluated with computer-aided
detection.

[L CC synth-2D]
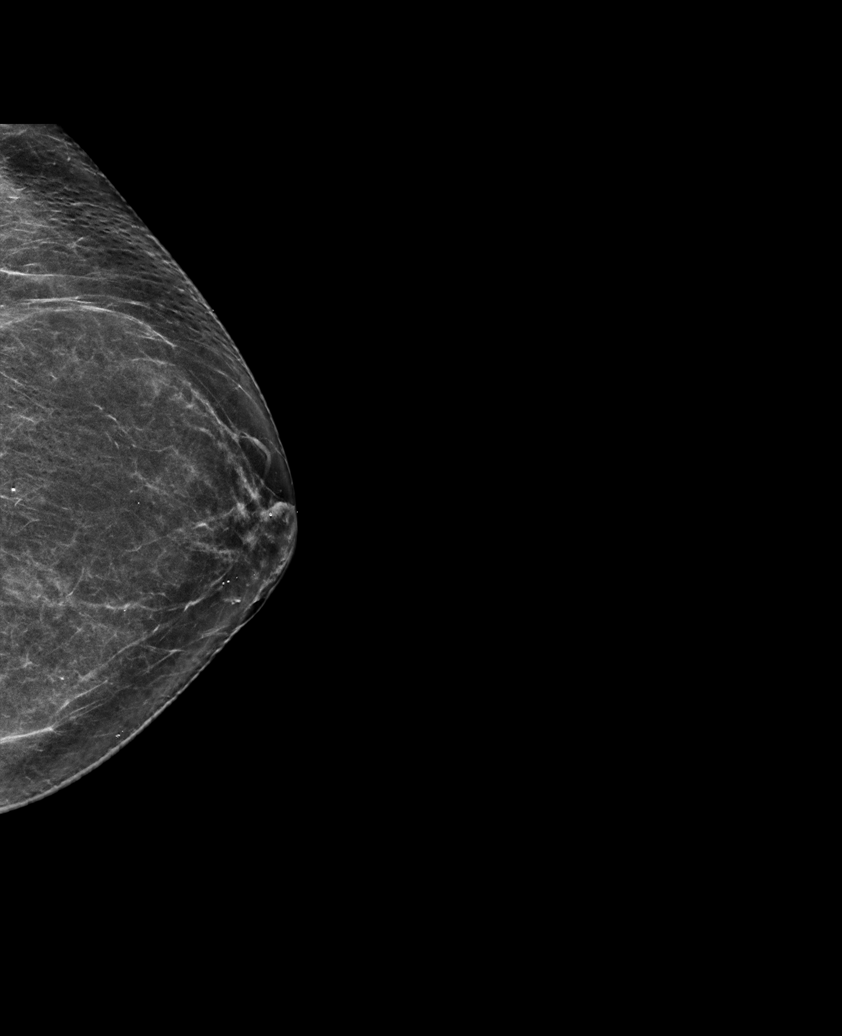

[R CC synth-2D (1 of 2)]
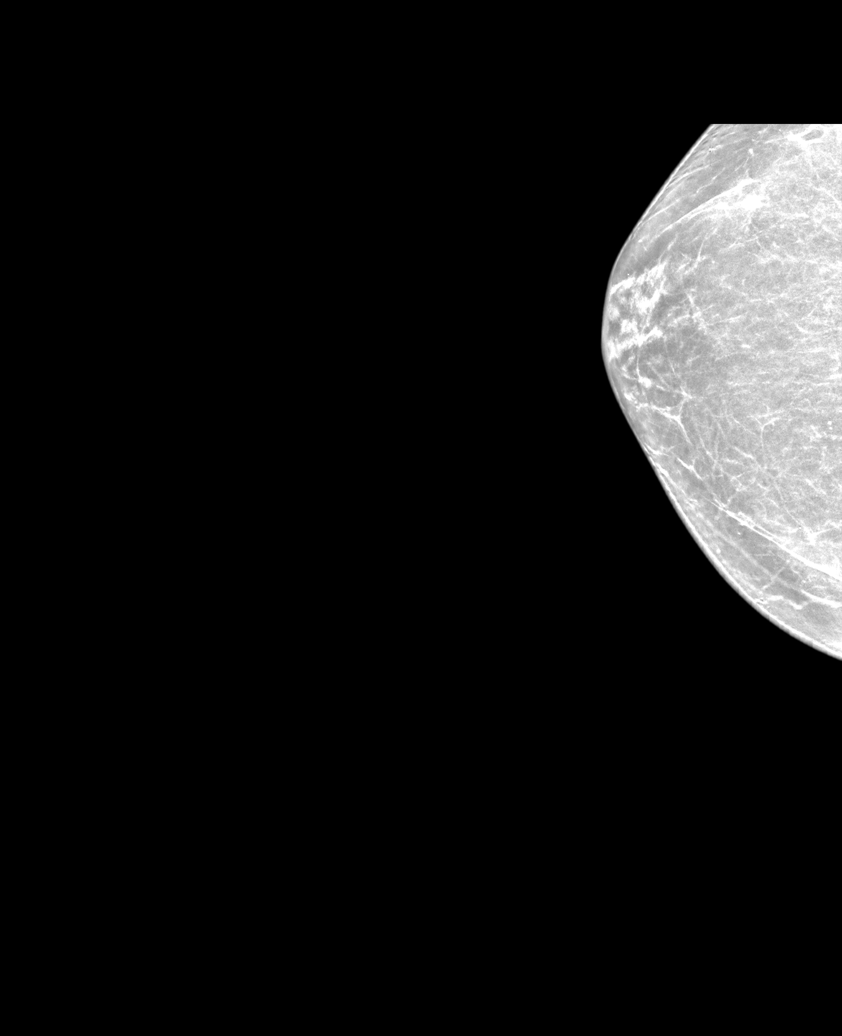

[R CC synth-2D (2 of 2)]
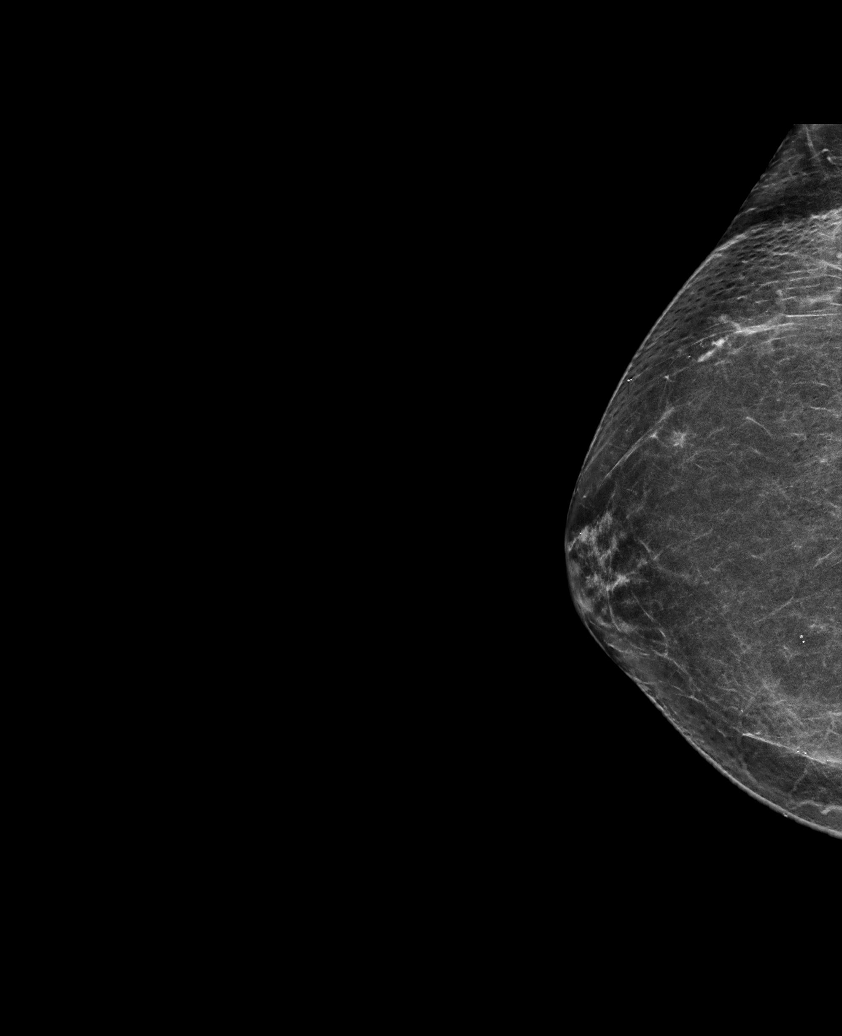

[L MLO synth-2D]
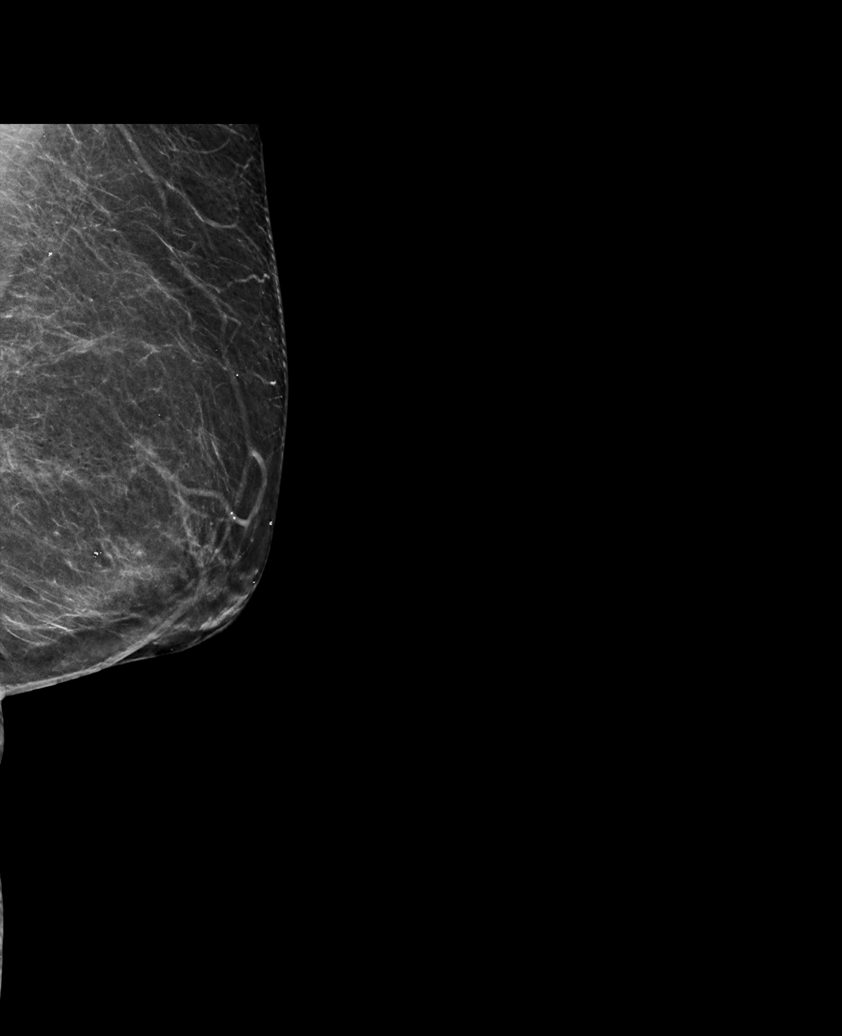

[R MLO synth-2D]
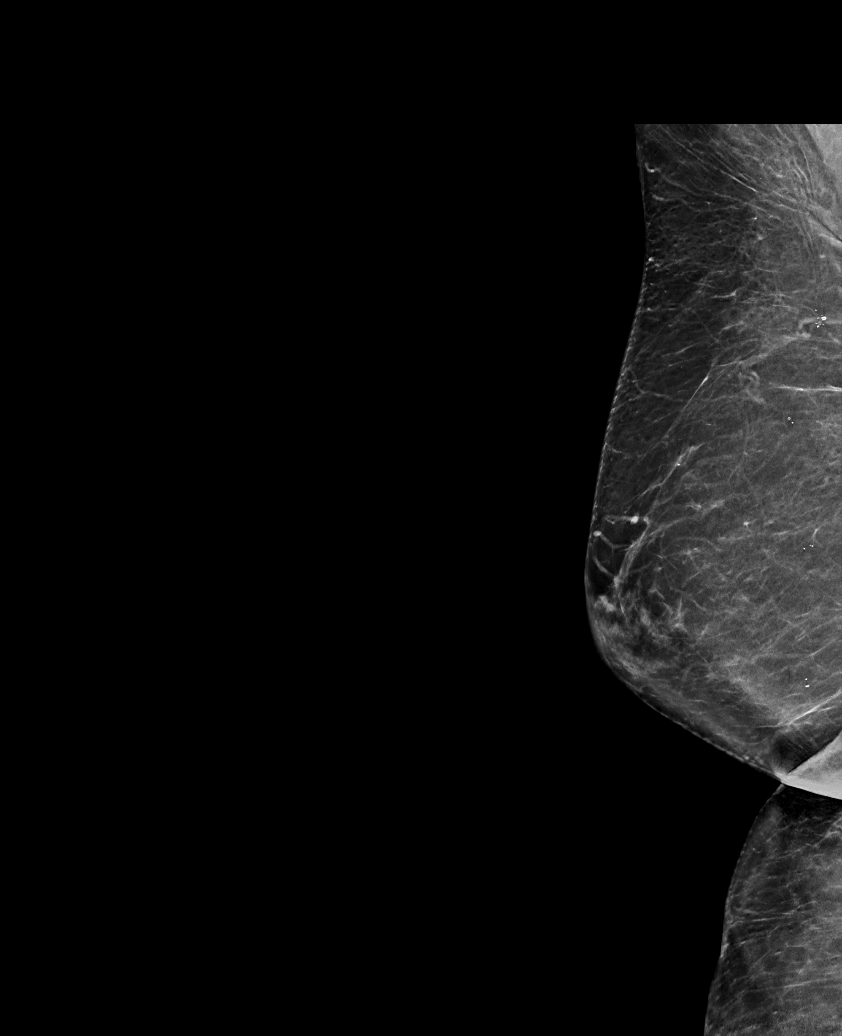

[R CC tomo · tomo slice 33/66.0]
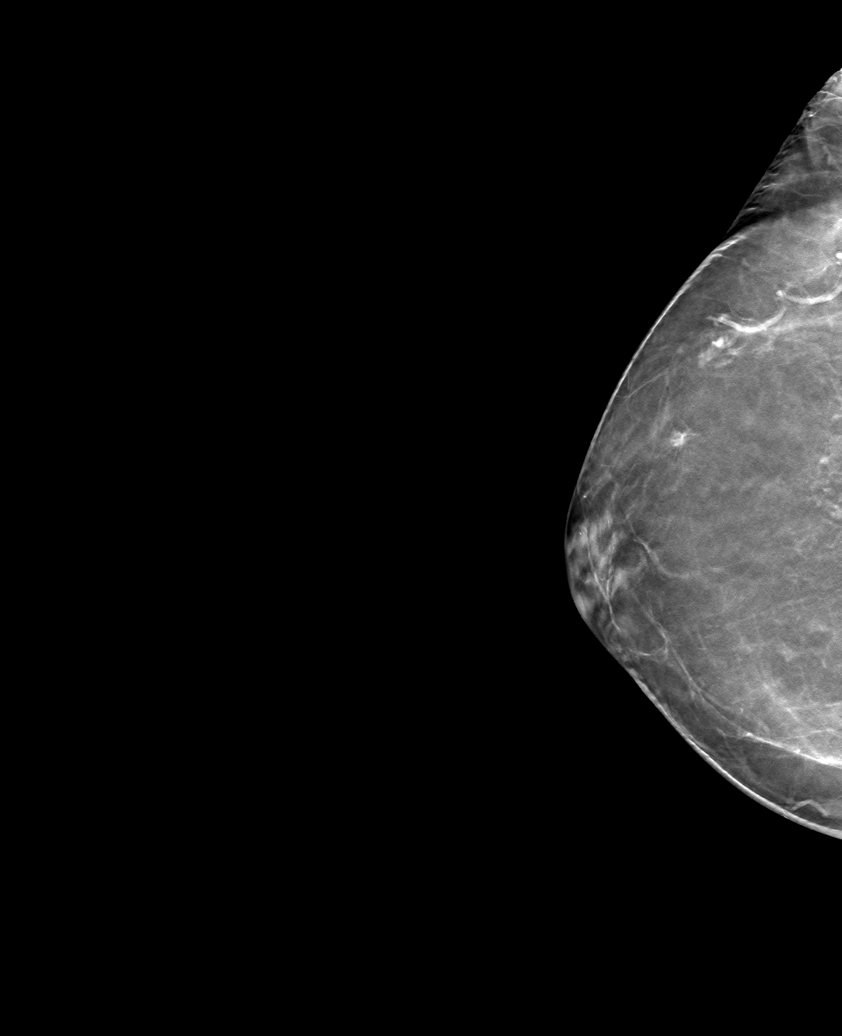

[6 of 30 positions shown; findings below may reference images not displayed]

ACR Breast Density Category b: There are scattered areas of
fibroglandular density.
FINDINGS: The small asymmetry in the lateral aspect of the right breast on the
CC view is unchanged from the prior exams.

There are no masses or new areas of asymmetry. There are no areas of
architectural distortion and no suspicious calcifications.
IMPRESSION: 1. No evidence of breast malignancy.
2. Small benign right breast asymmetry, stable for 2 years,
consistent with superimposed fibroglandular tissue.

RECOMMENDATION:
Screening mammogram in one year.(Code:[id number])

I have discussed the findings and recommendations with the patient.
If applicable, a reminder letter will be sent to the patient
regarding the next appointment.

BI-RADS CATEGORY  2: Benign.

## 2022-01-25 DIAGNOSIS — R600 Localized edema: Secondary | ICD-10-CM | POA: Diagnosis not present

## 2022-03-08 DIAGNOSIS — N390 Urinary tract infection, site not specified: Secondary | ICD-10-CM | POA: Diagnosis not present

## 2022-03-08 DIAGNOSIS — R600 Localized edema: Secondary | ICD-10-CM | POA: Diagnosis not present

## 2022-03-08 DIAGNOSIS — D692 Other nonthrombocytopenic purpura: Secondary | ICD-10-CM | POA: Diagnosis not present

## 2022-03-08 DIAGNOSIS — N2581 Secondary hyperparathyroidism of renal origin: Secondary | ICD-10-CM | POA: Diagnosis not present

## 2022-03-08 DIAGNOSIS — N182 Chronic kidney disease, stage 2 (mild): Secondary | ICD-10-CM | POA: Diagnosis not present

## 2022-03-16 DIAGNOSIS — Z85828 Personal history of other malignant neoplasm of skin: Secondary | ICD-10-CM | POA: Diagnosis not present

## 2022-03-16 DIAGNOSIS — D692 Other nonthrombocytopenic purpura: Secondary | ICD-10-CM | POA: Diagnosis not present

## 2022-04-06 DIAGNOSIS — E782 Mixed hyperlipidemia: Secondary | ICD-10-CM | POA: Diagnosis not present

## 2022-04-06 DIAGNOSIS — R600 Localized edema: Secondary | ICD-10-CM | POA: Diagnosis not present

## 2022-05-05 ENCOUNTER — Other Ambulatory Visit: Payer: Self-pay | Admitting: Family Medicine

## 2022-05-05 DIAGNOSIS — Z1231 Encounter for screening mammogram for malignant neoplasm of breast: Secondary | ICD-10-CM

## 2022-06-21 DIAGNOSIS — E782 Mixed hyperlipidemia: Secondary | ICD-10-CM | POA: Diagnosis not present

## 2022-06-21 DIAGNOSIS — D72821 Monocytosis (symptomatic): Secondary | ICD-10-CM | POA: Diagnosis not present

## 2022-06-26 DIAGNOSIS — E782 Mixed hyperlipidemia: Secondary | ICD-10-CM | POA: Diagnosis not present

## 2022-06-26 DIAGNOSIS — L299 Pruritus, unspecified: Secondary | ICD-10-CM | POA: Diagnosis not present

## 2022-06-26 DIAGNOSIS — I10 Essential (primary) hypertension: Secondary | ICD-10-CM | POA: Diagnosis not present

## 2022-06-26 DIAGNOSIS — D72821 Monocytosis (symptomatic): Secondary | ICD-10-CM | POA: Diagnosis not present

## 2022-06-26 DIAGNOSIS — Z0000 Encounter for general adult medical examination without abnormal findings: Secondary | ICD-10-CM | POA: Diagnosis not present

## 2022-06-27 ENCOUNTER — Ambulatory Visit
Admission: RE | Admit: 2022-06-27 | Discharge: 2022-06-27 | Disposition: A | Discharge status: Home / Self Care | Payer: Medicare Other | Source: Ambulatory Visit | Attending: Family Medicine | Admitting: Family Medicine

## 2022-06-27 DIAGNOSIS — Z1231 Encounter for screening mammogram for malignant neoplasm of breast: Secondary | ICD-10-CM

## 2022-07-04 DIAGNOSIS — R509 Fever, unspecified: Secondary | ICD-10-CM | POA: Diagnosis not present

## 2022-07-04 DIAGNOSIS — R051 Acute cough: Secondary | ICD-10-CM | POA: Diagnosis not present

## 2022-07-24 DIAGNOSIS — M1712 Unilateral primary osteoarthritis, left knee: Secondary | ICD-10-CM | POA: Diagnosis not present

## 2022-08-30 DIAGNOSIS — Z961 Presence of intraocular lens: Secondary | ICD-10-CM | POA: Diagnosis not present

## 2022-08-30 DIAGNOSIS — H52203 Unspecified astigmatism, bilateral: Secondary | ICD-10-CM | POA: Diagnosis not present

## 2022-08-30 DIAGNOSIS — H04123 Dry eye syndrome of bilateral lacrimal glands: Secondary | ICD-10-CM | POA: Diagnosis not present

## 2022-09-12 DIAGNOSIS — J309 Allergic rhinitis, unspecified: Secondary | ICD-10-CM | POA: Diagnosis not present

## 2022-09-12 DIAGNOSIS — R609 Edema, unspecified: Secondary | ICD-10-CM | POA: Diagnosis not present

## 2022-10-31 DIAGNOSIS — M25572 Pain in left ankle and joints of left foot: Secondary | ICD-10-CM | POA: Diagnosis not present

## 2022-10-31 DIAGNOSIS — M1712 Unilateral primary osteoarthritis, left knee: Secondary | ICD-10-CM | POA: Diagnosis not present

## 2022-11-01 DIAGNOSIS — N182 Chronic kidney disease, stage 2 (mild): Secondary | ICD-10-CM | POA: Diagnosis not present

## 2022-11-01 DIAGNOSIS — E559 Vitamin D deficiency, unspecified: Secondary | ICD-10-CM | POA: Diagnosis not present

## 2022-11-02 DIAGNOSIS — N182 Chronic kidney disease, stage 2 (mild): Secondary | ICD-10-CM | POA: Diagnosis not present

## 2022-11-08 DIAGNOSIS — N1831 Chronic kidney disease, stage 3a: Secondary | ICD-10-CM | POA: Diagnosis not present

## 2022-11-08 DIAGNOSIS — D631 Anemia in chronic kidney disease: Secondary | ICD-10-CM | POA: Diagnosis not present

## 2022-11-08 DIAGNOSIS — N2581 Secondary hyperparathyroidism of renal origin: Secondary | ICD-10-CM | POA: Diagnosis not present

## 2022-11-08 DIAGNOSIS — I129 Hypertensive chronic kidney disease with stage 1 through stage 4 chronic kidney disease, or unspecified chronic kidney disease: Secondary | ICD-10-CM | POA: Diagnosis not present

## 2022-12-28 DIAGNOSIS — M1712 Unilateral primary osteoarthritis, left knee: Secondary | ICD-10-CM | POA: Diagnosis not present

## 2023-01-10 DIAGNOSIS — N1831 Chronic kidney disease, stage 3a: Secondary | ICD-10-CM | POA: Diagnosis not present

## 2023-02-09 DIAGNOSIS — M1712 Unilateral primary osteoarthritis, left knee: Secondary | ICD-10-CM | POA: Diagnosis not present

## 2023-02-09 DIAGNOSIS — E871 Hypo-osmolality and hyponatremia: Secondary | ICD-10-CM | POA: Diagnosis not present

## 2023-05-08 DIAGNOSIS — M1712 Unilateral primary osteoarthritis, left knee: Secondary | ICD-10-CM | POA: Diagnosis not present

## 2023-06-01 ENCOUNTER — Other Ambulatory Visit: Payer: Self-pay | Admitting: Family Medicine

## 2023-06-01 DIAGNOSIS — Z1231 Encounter for screening mammogram for malignant neoplasm of breast: Secondary | ICD-10-CM

## 2023-06-04 DIAGNOSIS — R300 Dysuria: Secondary | ICD-10-CM | POA: Diagnosis not present

## 2023-06-25 DIAGNOSIS — N182 Chronic kidney disease, stage 2 (mild): Secondary | ICD-10-CM | POA: Diagnosis not present

## 2023-06-25 DIAGNOSIS — E222 Syndrome of inappropriate secretion of antidiuretic hormone: Secondary | ICD-10-CM | POA: Diagnosis not present

## 2023-06-25 DIAGNOSIS — I129 Hypertensive chronic kidney disease with stage 1 through stage 4 chronic kidney disease, or unspecified chronic kidney disease: Secondary | ICD-10-CM | POA: Diagnosis not present

## 2023-06-25 DIAGNOSIS — N1831 Chronic kidney disease, stage 3a: Secondary | ICD-10-CM | POA: Diagnosis not present

## 2023-06-25 DIAGNOSIS — N2581 Secondary hyperparathyroidism of renal origin: Secondary | ICD-10-CM | POA: Diagnosis not present

## 2023-07-02 DIAGNOSIS — D72821 Monocytosis (symptomatic): Secondary | ICD-10-CM | POA: Diagnosis not present

## 2023-07-02 DIAGNOSIS — N1831 Chronic kidney disease, stage 3a: Secondary | ICD-10-CM | POA: Diagnosis not present

## 2023-07-02 DIAGNOSIS — N2581 Secondary hyperparathyroidism of renal origin: Secondary | ICD-10-CM | POA: Diagnosis not present

## 2023-07-02 DIAGNOSIS — Z23 Encounter for immunization: Secondary | ICD-10-CM | POA: Diagnosis not present

## 2023-07-02 DIAGNOSIS — Z0000 Encounter for general adult medical examination without abnormal findings: Secondary | ICD-10-CM | POA: Diagnosis not present

## 2023-07-02 DIAGNOSIS — I10 Essential (primary) hypertension: Secondary | ICD-10-CM | POA: Diagnosis not present

## 2023-07-02 DIAGNOSIS — E782 Mixed hyperlipidemia: Secondary | ICD-10-CM | POA: Diagnosis not present

## 2023-07-05 ENCOUNTER — Ambulatory Visit: Payer: Medicare Other

## 2023-07-24 DIAGNOSIS — M1712 Unilateral primary osteoarthritis, left knee: Secondary | ICD-10-CM | POA: Diagnosis not present

## 2023-07-24 DIAGNOSIS — R350 Frequency of micturition: Secondary | ICD-10-CM | POA: Diagnosis not present

## 2023-08-13 DIAGNOSIS — R058 Other specified cough: Secondary | ICD-10-CM | POA: Diagnosis not present

## 2023-08-27 DIAGNOSIS — M1712 Unilateral primary osteoarthritis, left knee: Secondary | ICD-10-CM | POA: Diagnosis not present

## 2023-09-10 DIAGNOSIS — H04123 Dry eye syndrome of bilateral lacrimal glands: Secondary | ICD-10-CM | POA: Diagnosis not present

## 2023-09-10 DIAGNOSIS — H52203 Unspecified astigmatism, bilateral: Secondary | ICD-10-CM | POA: Diagnosis not present

## 2023-09-10 DIAGNOSIS — Z961 Presence of intraocular lens: Secondary | ICD-10-CM | POA: Diagnosis not present

## 2023-10-01 DIAGNOSIS — S0093XA Contusion of unspecified part of head, initial encounter: Secondary | ICD-10-CM | POA: Diagnosis not present

## 2023-10-01 DIAGNOSIS — M545 Low back pain, unspecified: Secondary | ICD-10-CM | POA: Diagnosis not present

## 2023-10-25 DIAGNOSIS — H8113 Benign paroxysmal vertigo, bilateral: Secondary | ICD-10-CM | POA: Diagnosis not present

## 2023-12-19 DIAGNOSIS — L570 Actinic keratosis: Secondary | ICD-10-CM | POA: Diagnosis not present

## 2023-12-19 DIAGNOSIS — L308 Other specified dermatitis: Secondary | ICD-10-CM | POA: Diagnosis not present

## 2023-12-19 DIAGNOSIS — L853 Xerosis cutis: Secondary | ICD-10-CM | POA: Diagnosis not present

## 2023-12-19 DIAGNOSIS — Z85828 Personal history of other malignant neoplasm of skin: Secondary | ICD-10-CM | POA: Diagnosis not present

## 2024-01-01 DIAGNOSIS — M1712 Unilateral primary osteoarthritis, left knee: Secondary | ICD-10-CM | POA: Diagnosis not present

## 2024-01-16 DIAGNOSIS — B353 Tinea pedis: Secondary | ICD-10-CM | POA: Diagnosis not present

## 2024-01-16 DIAGNOSIS — L570 Actinic keratosis: Secondary | ICD-10-CM | POA: Diagnosis not present

## 2024-01-16 DIAGNOSIS — Q828 Other specified congenital malformations of skin: Secondary | ICD-10-CM | POA: Diagnosis not present

## 2024-02-05 DIAGNOSIS — L03115 Cellulitis of right lower limb: Secondary | ICD-10-CM | POA: Diagnosis not present

## 2024-02-05 DIAGNOSIS — L988 Other specified disorders of the skin and subcutaneous tissue: Secondary | ICD-10-CM | POA: Diagnosis not present

## 2024-02-27 DIAGNOSIS — L570 Actinic keratosis: Secondary | ICD-10-CM | POA: Diagnosis not present

## 2024-02-27 DIAGNOSIS — Z85828 Personal history of other malignant neoplasm of skin: Secondary | ICD-10-CM | POA: Diagnosis not present

## 2024-03-25 DIAGNOSIS — M1712 Unilateral primary osteoarthritis, left knee: Secondary | ICD-10-CM | POA: Diagnosis not present

## 2024-04-21 DIAGNOSIS — Z85828 Personal history of other malignant neoplasm of skin: Secondary | ICD-10-CM | POA: Diagnosis not present

## 2024-04-21 DIAGNOSIS — L821 Other seborrheic keratosis: Secondary | ICD-10-CM | POA: Diagnosis not present

## 2024-04-21 DIAGNOSIS — L820 Inflamed seborrheic keratosis: Secondary | ICD-10-CM | POA: Diagnosis not present

## 2024-04-23 DIAGNOSIS — Z23 Encounter for immunization: Secondary | ICD-10-CM | POA: Diagnosis not present

## 2024-04-23 DIAGNOSIS — S7002XA Contusion of left hip, initial encounter: Secondary | ICD-10-CM | POA: Diagnosis not present

## 2024-05-01 DIAGNOSIS — R059 Cough, unspecified: Secondary | ICD-10-CM | POA: Diagnosis not present

## 2024-05-01 DIAGNOSIS — H5702 Anisocoria: Secondary | ICD-10-CM | POA: Diagnosis not present
